# Patient Record
Sex: Female | Born: 1960 | Race: Black or African American | Hispanic: No
Health system: Southern US, Community
[De-identification: ages and names within clinical notes are randomized; demographics above are authoritative.]

## PROBLEM LIST (undated history)

## (undated) DIAGNOSIS — E119 Type 2 diabetes mellitus without complications: Secondary | ICD-10-CM

## (undated) DIAGNOSIS — I2699 Other pulmonary embolism without acute cor pulmonale: Secondary | ICD-10-CM

## (undated) DIAGNOSIS — I219 Acute myocardial infarction, unspecified: Secondary | ICD-10-CM

## (undated) DIAGNOSIS — I509 Heart failure, unspecified: Secondary | ICD-10-CM

## (undated) HISTORY — PX: BELOW KNEE LEG AMPUTATION: SUR23

---

## 2015-11-14 ENCOUNTER — Inpatient Hospital Stay (HOSPITAL_COMMUNITY)
Admission: EM | Admit: 2015-11-14 | Discharge: 2015-11-26 | DRG: 208 | Disposition: E | Payer: Self-pay | Attending: Pulmonary Disease | Admitting: Pulmonary Disease

## 2015-11-14 ENCOUNTER — Inpatient Hospital Stay (HOSPITAL_COMMUNITY): Payer: Self-pay

## 2015-11-14 ENCOUNTER — Emergency Department (HOSPITAL_COMMUNITY): Payer: Self-pay

## 2015-11-14 ENCOUNTER — Encounter (HOSPITAL_COMMUNITY): Payer: Self-pay

## 2015-11-14 DIAGNOSIS — R001 Bradycardia, unspecified: Secondary | ICD-10-CM | POA: Diagnosis present

## 2015-11-14 DIAGNOSIS — I679 Cerebrovascular disease, unspecified: Secondary | ICD-10-CM

## 2015-11-14 DIAGNOSIS — I6529 Occlusion and stenosis of unspecified carotid artery: Secondary | ICD-10-CM

## 2015-11-14 DIAGNOSIS — Z515 Encounter for palliative care: Secondary | ICD-10-CM | POA: Diagnosis not present

## 2015-11-14 DIAGNOSIS — E785 Hyperlipidemia, unspecified: Secondary | ICD-10-CM | POA: Diagnosis present

## 2015-11-14 DIAGNOSIS — I2699 Other pulmonary embolism without acute cor pulmonale: Secondary | ICD-10-CM

## 2015-11-14 DIAGNOSIS — F1910 Other psychoactive substance abuse, uncomplicated: Secondary | ICD-10-CM | POA: Diagnosis present

## 2015-11-14 DIAGNOSIS — I2782 Chronic pulmonary embolism: Secondary | ICD-10-CM

## 2015-11-14 DIAGNOSIS — Z794 Long term (current) use of insulin: Secondary | ICD-10-CM

## 2015-11-14 DIAGNOSIS — Z66 Do not resuscitate: Secondary | ICD-10-CM | POA: Diagnosis not present

## 2015-11-14 DIAGNOSIS — I5023 Acute on chronic systolic (congestive) heart failure: Secondary | ICD-10-CM | POA: Diagnosis present

## 2015-11-14 DIAGNOSIS — I5021 Acute systolic (congestive) heart failure: Secondary | ICD-10-CM

## 2015-11-14 DIAGNOSIS — E669 Obesity, unspecified: Secondary | ICD-10-CM | POA: Diagnosis present

## 2015-11-14 DIAGNOSIS — R402342 Coma scale, best motor response, flexion withdrawal, at arrival to emergency department: Secondary | ICD-10-CM | POA: Diagnosis present

## 2015-11-14 DIAGNOSIS — G9341 Metabolic encephalopathy: Secondary | ICD-10-CM | POA: Diagnosis present

## 2015-11-14 DIAGNOSIS — Z7901 Long term (current) use of anticoagulants: Secondary | ICD-10-CM

## 2015-11-14 DIAGNOSIS — N19 Unspecified kidney failure: Secondary | ICD-10-CM | POA: Diagnosis present

## 2015-11-14 DIAGNOSIS — R52 Pain, unspecified: Secondary | ICD-10-CM

## 2015-11-14 DIAGNOSIS — R402142 Coma scale, eyes open, spontaneous, at arrival to emergency department: Secondary | ICD-10-CM | POA: Diagnosis present

## 2015-11-14 DIAGNOSIS — I63412 Cerebral infarction due to embolism of left middle cerebral artery: Secondary | ICD-10-CM

## 2015-11-14 DIAGNOSIS — Z9289 Personal history of other medical treatment: Secondary | ICD-10-CM

## 2015-11-14 DIAGNOSIS — I639 Cerebral infarction, unspecified: Secondary | ICD-10-CM

## 2015-11-14 DIAGNOSIS — J9601 Acute respiratory failure with hypoxia: Principal | ICD-10-CM

## 2015-11-14 DIAGNOSIS — R579 Shock, unspecified: Secondary | ICD-10-CM

## 2015-11-14 DIAGNOSIS — Z8673 Personal history of transient ischemic attack (TIA), and cerebral infarction without residual deficits: Secondary | ICD-10-CM

## 2015-11-14 DIAGNOSIS — Z6827 Body mass index (BMI) 27.0-27.9, adult: Secondary | ICD-10-CM

## 2015-11-14 DIAGNOSIS — I252 Old myocardial infarction: Secondary | ICD-10-CM

## 2015-11-14 DIAGNOSIS — Z86711 Personal history of pulmonary embolism: Secondary | ICD-10-CM

## 2015-11-14 DIAGNOSIS — J9600 Acute respiratory failure, unspecified whether with hypoxia or hypercapnia: Secondary | ICD-10-CM | POA: Diagnosis present

## 2015-11-14 DIAGNOSIS — D649 Anemia, unspecified: Secondary | ICD-10-CM | POA: Diagnosis present

## 2015-11-14 DIAGNOSIS — N1330 Unspecified hydronephrosis: Secondary | ICD-10-CM | POA: Diagnosis present

## 2015-11-14 DIAGNOSIS — Z89511 Acquired absence of right leg below knee: Secondary | ICD-10-CM

## 2015-11-14 DIAGNOSIS — R569 Unspecified convulsions: Secondary | ICD-10-CM | POA: Diagnosis present

## 2015-11-14 DIAGNOSIS — I25810 Atherosclerosis of coronary artery bypass graft(s) without angina pectoris: Secondary | ICD-10-CM | POA: Diagnosis present

## 2015-11-14 DIAGNOSIS — Z79899 Other long term (current) drug therapy: Secondary | ICD-10-CM

## 2015-11-14 DIAGNOSIS — I110 Hypertensive heart disease with heart failure: Secondary | ICD-10-CM | POA: Diagnosis present

## 2015-11-14 DIAGNOSIS — E11649 Type 2 diabetes mellitus with hypoglycemia without coma: Secondary | ICD-10-CM | POA: Diagnosis not present

## 2015-11-14 DIAGNOSIS — I959 Hypotension, unspecified: Secondary | ICD-10-CM | POA: Diagnosis present

## 2015-11-14 DIAGNOSIS — J9602 Acute respiratory failure with hypercapnia: Secondary | ICD-10-CM | POA: Diagnosis present

## 2015-11-14 DIAGNOSIS — G9389 Other specified disorders of brain: Secondary | ICD-10-CM | POA: Diagnosis present

## 2015-11-14 DIAGNOSIS — I5022 Chronic systolic (congestive) heart failure: Secondary | ICD-10-CM

## 2015-11-14 DIAGNOSIS — L8990 Pressure ulcer of unspecified site, unspecified stage: Secondary | ICD-10-CM | POA: Diagnosis present

## 2015-11-14 DIAGNOSIS — I2510 Atherosclerotic heart disease of native coronary artery without angina pectoris: Secondary | ICD-10-CM | POA: Diagnosis present

## 2015-11-14 DIAGNOSIS — N200 Calculus of kidney: Secondary | ICD-10-CM

## 2015-11-14 DIAGNOSIS — I6340 Cerebral infarction due to embolism of unspecified cerebral artery: Secondary | ICD-10-CM | POA: Diagnosis present

## 2015-11-14 DIAGNOSIS — I161 Hypertensive emergency: Secondary | ICD-10-CM | POA: Diagnosis present

## 2015-11-14 DIAGNOSIS — R402242 Coma scale, best verbal response, confused conversation, at arrival to emergency department: Secondary | ICD-10-CM | POA: Diagnosis present

## 2015-11-14 DIAGNOSIS — G9340 Encephalopathy, unspecified: Secondary | ICD-10-CM

## 2015-11-14 HISTORY — DX: Acute myocardial infarction, unspecified: I21.9

## 2015-11-14 HISTORY — DX: Heart failure, unspecified: I50.9

## 2015-11-14 HISTORY — DX: Other pulmonary embolism without acute cor pulmonale: I26.99

## 2015-11-14 HISTORY — DX: Type 2 diabetes mellitus without complications: E11.9

## 2015-11-14 LAB — I-STAT ARTERIAL BLOOD GAS, ED
Acid-base deficit: 2 mmol/L (ref 0.0–2.0)
Acid-base deficit: 1 mmol/L (ref 0.0–2.0)
Acid-base deficit: 1 mmol/L (ref 0.0–2.0)
BICARBONATE: 26.5 meq/L — AB (ref 20.0–24.0)
BICARBONATE: 23.9 meq/L (ref 20.0–24.0)
Bicarbonate: 26 mEq/L — ABNORMAL HIGH (ref 20.0–24.0)
O2 SAT: 91 %
O2 Saturation: 86 %
O2 Saturation: 89 %
PCO2 ART: 56.3 mmHg — AB (ref 35.0–45.0)
PCO2 ART: 53.4 mmHg — AB (ref 35.0–45.0)
PO2 ART: 64 mmHg — AB (ref 80.0–100.0)
PO2 ART: 55 mmHg — AB (ref 80.0–100.0)
Patient temperature: 35.1
Patient temperature: 34.9
TCO2: 28 mmol/L (ref 0–100)
TCO2: 28 mmol/L (ref 0–100)
TCO2: 25 mmol/L (ref 0–100)
pCO2 arterial: 37.9 mmHg (ref 35.0–45.0)
pH, Arterial: 7.262 — ABNORMAL LOW (ref 7.350–7.450)
pH, Arterial: 7.293 — ABNORMAL LOW (ref 7.350–7.450)
pH, Arterial: 7.408 (ref 7.350–7.450)
pO2, Arterial: 51 mmHg — ABNORMAL LOW (ref 80.0–100.0)

## 2015-11-14 LAB — COMPREHENSIVE METABOLIC PANEL
ALK PHOS: 143 U/L — AB (ref 38–126)
ALT: 55 U/L — ABNORMAL HIGH (ref 14–54)
AST: 89 U/L — AB (ref 15–41)
Albumin: 2.9 g/dL — ABNORMAL LOW (ref 3.5–5.0)
Anion gap: 13 (ref 5–15)
BILIRUBIN TOTAL: 0.7 mg/dL (ref 0.3–1.2)
BUN: 30 mg/dL — AB (ref 6–20)
CALCIUM: 8.4 mg/dL — AB (ref 8.9–10.3)
CO2: 21 mmol/L — ABNORMAL LOW (ref 22–32)
Chloride: 104 mmol/L (ref 101–111)
Creatinine, Ser: 1.33 mg/dL — ABNORMAL HIGH (ref 0.44–1.00)
GFR calc Af Amer: 52 mL/min — ABNORMAL LOW (ref >60)
GFR calc non Af Amer: 45 mL/min — ABNORMAL LOW (ref >60)
GLUCOSE: 98 mg/dL (ref 65–99)
Potassium: 4.8 mmol/L (ref 3.5–5.1)
SODIUM: 138 mmol/L (ref 135–145)
TOTAL PROTEIN: 7 g/dL (ref 6.5–8.1)

## 2015-11-14 LAB — I-STAT CG4 LACTIC ACID, ED: LACTIC ACID, VENOUS: 2.44 mmol/L — AB (ref 0.5–2.0)

## 2015-11-14 LAB — URINALYSIS, ROUTINE W REFLEX MICROSCOPIC
Bilirubin Urine: NEGATIVE
GLUCOSE, UA: NEGATIVE mg/dL
Ketones, ur: NEGATIVE mg/dL
LEUKOCYTES UA: NEGATIVE
Nitrite: NEGATIVE
PH: 6.5 (ref 5.0–8.0)
Specific Gravity, Urine: 1.015 (ref 1.005–1.030)

## 2015-11-14 LAB — TROPONIN I: TROPONIN I: 0.05 ng/mL — AB (ref <0.031)

## 2015-11-14 LAB — CBC
HCT: 32.5 % — ABNORMAL LOW (ref 36.0–46.0)
Hemoglobin: 10.2 g/dL — ABNORMAL LOW (ref 12.0–15.0)
MCH: 28.1 pg (ref 26.0–34.0)
MCHC: 31.4 g/dL (ref 30.0–36.0)
MCV: 89.5 fL (ref 78.0–100.0)
PLATELETS: 360 10*3/uL (ref 150–400)
RBC: 3.63 MIL/uL — ABNORMAL LOW (ref 3.87–5.11)
RDW: 14.9 % (ref 11.5–15.5)
WBC: 9.3 10*3/uL (ref 4.0–10.5)

## 2015-11-14 LAB — CBG MONITORING, ED: Glucose-Capillary: 89 mg/dL (ref 65–99)

## 2015-11-14 LAB — PROTIME-INR
INR: 1.34 (ref 0.00–1.49)
INR: 1.38 (ref 0.00–1.49)
Prothrombin Time: 16.7 seconds — ABNORMAL HIGH (ref 11.6–15.2)
Prothrombin Time: 17.1 seconds — ABNORMAL HIGH (ref 11.6–15.2)

## 2015-11-14 LAB — I-STAT CHEM 8, ED
BUN: 34 mg/dL — AB (ref 6–20)
CALCIUM ION: 1.08 mmol/L — AB (ref 1.12–1.23)
CHLORIDE: 103 mmol/L (ref 101–111)
Creatinine, Ser: 1.2 mg/dL — ABNORMAL HIGH (ref 0.44–1.00)
GLUCOSE: 88 mg/dL (ref 65–99)
HCT: 39 % (ref 36.0–46.0)
Hemoglobin: 13.3 g/dL (ref 12.0–15.0)
Potassium: 4.8 mmol/L (ref 3.5–5.1)
Sodium: 139 mmol/L (ref 135–145)
TCO2: 25 mmol/L (ref 0–100)

## 2015-11-14 LAB — I-STAT TROPONIN, ED: Troponin i, poc: 0.02 ng/mL (ref 0.00–0.08)

## 2015-11-14 LAB — LIPID PANEL
CHOL/HDL RATIO: 2.3 ratio
Cholesterol: 117 mg/dL (ref 0–200)
HDL: 51 mg/dL (ref >40)
LDL CALC: 61 mg/dL (ref 0–99)
TRIGLYCERIDES: 23 mg/dL (ref <150)
VLDL: 5 mg/dL (ref 0–40)

## 2015-11-14 LAB — LACTIC ACID, PLASMA: Lactic Acid, Venous: 1.1 mmol/L (ref 0.5–2.0)

## 2015-11-14 LAB — URINE MICROSCOPIC-ADD ON

## 2015-11-14 LAB — PHOSPHORUS: Phosphorus: 5.1 mg/dL — ABNORMAL HIGH (ref 2.5–4.6)

## 2015-11-14 LAB — HEPARIN LEVEL (UNFRACTIONATED): HEPARIN UNFRACTIONATED: 0.82 [IU]/mL — AB (ref 0.30–0.70)

## 2015-11-14 LAB — GLUCOSE, CAPILLARY
GLUCOSE-CAPILLARY: 52 mg/dL — AB (ref 65–99)
Glucose-Capillary: 110 mg/dL — ABNORMAL HIGH (ref 65–99)

## 2015-11-14 LAB — MAGNESIUM: Magnesium: 2.1 mg/dL (ref 1.7–2.4)

## 2015-11-14 LAB — BRAIN NATRIURETIC PEPTIDE: B Natriuretic Peptide: 1608.9 pg/mL — ABNORMAL HIGH (ref 0.0–100.0)

## 2015-11-14 LAB — APTT: aPTT: 35 seconds (ref 24–37)

## 2015-11-14 MED ORDER — SODIUM CHLORIDE 0.9% FLUSH: 3.0000 mL | INTRAVENOUS | Status: DC | PRN

## 2015-11-14 MED ORDER — ASPIRIN 81 MG PO CHEW: 324.0000 mg | CHEWABLE_TABLET | ORAL | Status: DC

## 2015-11-14 MED ORDER — ROCURONIUM BROMIDE 50 MG/5ML IV SOLN
INTRAVENOUS | Status: DC | PRN
  Administered 2015-11-14: 100 mg via INTRAVENOUS

## 2015-11-14 MED ORDER — SODIUM CHLORIDE 0.9 % IV SOLN: 250.0000 mL | INTRAVENOUS | Status: DC | PRN

## 2015-11-14 MED ORDER — INSULIN ASPART 100 UNIT/ML ~~LOC~~ SOLN
2.0000 [IU] | SUBCUTANEOUS | Status: DC
  Administered 2015-11-15: 4 [IU] via SUBCUTANEOUS

## 2015-11-14 MED ORDER — SODIUM CHLORIDE 0.9 % IV SOLN
25.0000 ug/h | INTRAVENOUS | Status: DC
  Administered 2015-11-14: 50 ug/h via INTRAVENOUS
  Administered 2015-11-15: 100 ug/h via INTRAVENOUS
  Filled 2015-11-14 (×3): qty 50

## 2015-11-14 MED ORDER — FENTANYL CITRATE (PF) 100 MCG/2ML IJ SOLN
50.0000 ug | Freq: Once | INTRAMUSCULAR | Status: AC
  Administered 2015-11-14: 50 ug via INTRAVENOUS
  Filled 2015-11-14: qty 2

## 2015-11-14 MED ORDER — PROPOFOL 1000 MG/100ML IV EMUL
INTRAVENOUS | Status: AC
Start: 2015-11-14 — End: 2015-11-14
  Administered 2015-11-14: 10 ug/kg/min via INTRAVENOUS
  Filled 2015-11-14: qty 100

## 2015-11-14 MED ORDER — HEPARIN (PORCINE) IN NACL 100-0.45 UNIT/ML-% IJ SOLN
1150.0000 [IU]/h | INTRAMUSCULAR | Status: DC
  Administered 2015-11-14: 1300 [IU]/h via INTRAVENOUS
  Filled 2015-11-14 (×3): qty 250

## 2015-11-14 MED ORDER — ASPIRIN 81 MG PO CHEW
324.0000 mg | CHEWABLE_TABLET | Freq: Once | ORAL | Status: AC
  Administered 2015-11-14: 324 mg via ORAL
  Filled 2015-11-14: qty 4

## 2015-11-14 MED ORDER — FUROSEMIDE 10 MG/ML IJ SOLN: 40.0000 mg | Freq: Once | INTRAMUSCULAR | Status: DC

## 2015-11-14 MED ORDER — MIDAZOLAM HCL 2 MG/2ML IJ SOLN
2.0000 mg | INTRAMUSCULAR | Status: DC | PRN
  Administered 2015-11-15 (×5): 2 mg via INTRAVENOUS
  Filled 2015-11-14 (×5): qty 2

## 2015-11-14 MED ORDER — AMLODIPINE BESYLATE 10 MG PO TABS
10.0000 mg | ORAL_TABLET | Freq: Every day | ORAL | Status: DC
  Administered 2015-11-15: 10 mg via ORAL
  Filled 2015-11-14: qty 1

## 2015-11-14 MED ORDER — LEVETIRACETAM 250 MG PO TABS
500.0000 mg | ORAL_TABLET | Freq: Two times a day (BID) | ORAL | Status: DC
  Administered 2015-11-14: 500 mg via ORAL
  Filled 2015-11-14: qty 2

## 2015-11-14 MED ORDER — IOPAMIDOL (ISOVUE-370) INJECTION 76%
INTRAVENOUS | Status: AC
  Administered 2015-11-14: 19:00:00
  Filled 2015-11-14: qty 100

## 2015-11-14 MED ORDER — MIDAZOLAM HCL 2 MG/2ML IJ SOLN
2.0000 mg | INTRAMUSCULAR | Status: AC | PRN
  Administered 2015-11-15 (×3): 2 mg via INTRAVENOUS
  Filled 2015-11-14 (×3): qty 2

## 2015-11-14 MED ORDER — FENTANYL BOLUS VIA INFUSION
50.0000 ug | INTRAVENOUS | Status: DC | PRN
  Filled 2015-11-14: qty 50

## 2015-11-14 MED ORDER — ASPIRIN 300 MG RE SUPP: 300.0000 mg | RECTAL | Status: DC

## 2015-11-14 MED ORDER — NOREPINEPHRINE BITARTRATE 1 MG/ML IV SOLN
0.0000 ug/min | INTRAVENOUS | Status: DC
  Administered 2015-11-15: 2 ug/min via INTRAVENOUS
  Filled 2015-11-14 (×2): qty 4

## 2015-11-14 MED ORDER — FUROSEMIDE 10 MG/ML IJ SOLN
40.0000 mg | INTRAMUSCULAR | Status: AC
  Administered 2015-11-14: 40 mg via INTRAVENOUS
  Filled 2015-11-14: qty 4

## 2015-11-14 MED ORDER — DEXTROSE 50 % IV SOLN
25.0000 mL | Freq: Once | INTRAVENOUS | Status: AC
  Administered 2015-11-14: 25 mL via INTRAVENOUS

## 2015-11-14 MED ORDER — NITROGLYCERIN 0.4 MG SL SUBL: 0.4000 mg | SUBLINGUAL_TABLET | SUBLINGUAL | Status: DC | PRN

## 2015-11-14 MED ORDER — PROPOFOL 1000 MG/100ML IV EMUL
5.0000 ug/kg/min | Freq: Once | INTRAVENOUS | Status: AC
  Administered 2015-11-14: 10 ug/kg/min via INTRAVENOUS

## 2015-11-14 MED ORDER — NITROGLYCERIN IN D5W 200-5 MCG/ML-% IV SOLN: 5.0000 ug/min | Freq: Once | INTRAVENOUS | Status: DC

## 2015-11-14 MED ORDER — PANTOPRAZOLE SODIUM 40 MG IV SOLR
40.0000 mg | Freq: Every day | INTRAVENOUS | Status: DC
  Administered 2015-11-14 – 2015-11-15 (×2): 40 mg via INTRAVENOUS
  Filled 2015-11-14 (×2): qty 40

## 2015-11-14 MED ORDER — DEXTROSE 50 % IV SOLN
INTRAVENOUS | Status: AC
  Filled 2015-11-14: qty 50

## 2015-11-14 MED ORDER — PROPOFOL 1000 MG/100ML IV EMUL
5.0000 ug/kg/min | INTRAVENOUS | Status: DC
  Administered 2015-11-14: 35 ug/kg/min via INTRAVENOUS
  Administered 2015-11-15: 40 ug/kg/min via INTRAVENOUS
  Administered 2015-11-15: 30 ug/kg/min via INTRAVENOUS
  Administered 2015-11-15: 50 ug/kg/min via INTRAVENOUS
  Administered 2015-11-16: 40 ug/kg/min via INTRAVENOUS
  Administered 2015-11-16 (×2): 50 ug/kg/min via INTRAVENOUS
  Filled 2015-11-14 (×7): qty 100

## 2015-11-14 MED ORDER — ETOMIDATE 2 MG/ML IV SOLN
INTRAVENOUS | Status: DC | PRN
  Administered 2015-11-14: 20 mg via INTRAVENOUS

## 2015-11-14 MED ORDER — SODIUM CHLORIDE 0.9% FLUSH
3.0000 mL | Freq: Two times a day (BID) | INTRAVENOUS | Status: DC
Start: 2015-11-14 — End: 2015-11-16
  Administered 2015-11-14 – 2015-11-16 (×3): 3 mL via INTRAVENOUS

## 2015-11-14 NOTE — ED Notes (Signed)
Pt transported to CT by this RN and Respiratory.

## 2015-11-14 NOTE — ED Provider Notes (Signed)
CSN: [id number]     Arrival date & time 11/22/2015  1617 History   First MD Initiated Contact with Patient 22-Nov-2015 1623     Chief Complaint  Patient presents with  . Respiratory Distress     (Consider location/radiation/quality/duration/timing/severity/associated sxs/prior Treatment) HPI Comments: The patient is a 55 year old female, we do not have any prior medical records on this patient in the system, she is unable to speak due to severe respiratory distress. She presents by ambulance transport after a phone call went out for respiratory distress. They found the patient to be hypoxic, using accessory muscles and confused and combative. Reportedly the patient is a diabetic as well as having congestive heart failure. 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  The patient refused BiPAP, she eventually became obtunded in route, she was given 2 mg of Versed by intramuscular route at my request through EMS prehospital.  The history is provided by the patient and the EMS personnel.    Past Medical History  Diagnosis Date  . Diabetes mellitus without complication (HCC)   . CHF (congestive heart failure) (HCC)   . PE (pulmonary embolism)   . MI (myocardial infarction) [facility])    Past Surgical History  Procedure Laterality Date  . Below knee leg amputation     No family history on file. Social History  Substance Use Topics  . Smoking status: Not on file  . Smokeless tobacco: Not on file  . Alcohol Use: Not on file   OB History    No data available     Review of Systems  Unable to perform ROS: Severe respiratory distress      Allergies  Review of patient's allergies indicates no known allergies.  Home Medications   Prior to Admission medications   Medication Sig Start Date End Date Taking? Authorizing Provider  amLODipine (NORVASC) 10 MG tablet Take 10 mg by mouth daily.   Yes Historical  Provider, MD  atorvastatin (LIPITOR) 80 MG tablet Take 80 mg by mouth daily.   Yes Historical Provider, MD  busPIRone (BUSPAR) 10 MG tablet Take 10 mg by mouth 3 (three) times daily.   Yes Historical Provider, MD  insulin glargine (LANTUS) 100 UNIT/ML injection Inject 40 Units into the skin at bedtime.   Yes Historical Provider, MD  insulin lispro (HUMALOG) 100 UNIT/ML injection Inject 6 Units into the skin 3 (three) times daily before meals.   Yes Historical Provider, MD  levETIRAcetam (KEPPRA) 500 MG tablet Take 500 mg by mouth 2 (two) times daily.   Yes Historical Provider, MD  metoprolol tartrate (LOPRESSOR) 25 MG tablet Take 25 mg by mouth 2 (two) times daily.   Yes Historical Provider, MD  traMADol (ULTRAM) 50 MG tablet Take 50 mg by mouth every 6 (six) hours as needed for moderate pain.   Yes Historical Provider, MD  XARELTO STARTER PACK 15 & 20 MG TBPK SEE INSTRUCTIONS ON PACKET-15MG  TWICE DAILY FOR 21 DAYS, THEN SWITCHING TO  ONCE DAILY (STARTED 11/13/15) 11/12/15  Yes Historical Provider, MD   BP 104/70 mmHg  Pulse 58  Temp(Src) 98.1 F (36.7 C)  Resp 26  Ht  (1.651 m)  Wt 164 lb 14.5 oz (74.8 kg)  BMI 27.44 kg/m2  SpO2 100%  LMP  Physical Exam  Constitutional: She appears well-developed and well-nourished. She appears distressed.  HENT:  Head: Normocephalic and atraumatic.  Mouth/Throat:  Oropharynx is clear and moist. No oropharyngeal exudate.  Eyes: Conjunctivae and EOM are normal. Pupils are equal, round, and reactive to light. Right eye exhibits no discharge. Left eye exhibits no discharge. No scleral icterus.  Neck: Normal range of motion. Neck supple. No JVD present. No thyromegaly present.  Cardiovascular: Normal rate, regular rhythm, normal heart sounds and intact distal pulses.  Exam reveals no gallop and no friction rub.   No murmur heard. Pulmonary/Chest: She is in respiratory distress. She has wheezes. She has rales.  Abdominal: Soft. Bowel sounds are  normal. She exhibits no distension and no mass. There is no tenderness.  Musculoskeletal: Normal range of motion. She exhibits edema. She exhibits no tenderness.  Lymphadenopathy:    She has no cervical adenopathy.  Neurological:  Obtunded, does not follow commands, combative occasionally  Skin: Skin is warm and dry. No rash noted. No erythema.  Psychiatric: She has a normal mood and affect. Her behavior is normal.  Nursing note and vitals reviewed.   ED Course  .Intubation Performed by: Eber Hong Authorized by: Eber Hong Consent: The procedure was performed in an emergent situation. Required items: required blood products, implants, devices, and special equipment available Patient identity confirmed: arm band Time out: Immediately prior to procedure a "time out" was called to verify the correct patient, procedure, equipment, support staff and site/side marked as required. Indications: respiratory distress Intubation method: direct Patient status: paralyzed (RSI) Preoxygenation: BVM Sedatives: etomidate Paralytic: rocuronium Laryngoscope size: Mac 4 Tube size: 7.5 mm Tube type: cuffed Number of attempts: 1 Cricoid pressure: no Cords visualized: yes Post-procedure assessment: chest rise and CO2 detector Breath sounds: equal and absent over the epigastrium Cuff inflated: yes ETT to lip: 21 cm ETT to teeth: 20 cm Tube secured with: ETT holder Chest x-ray interpreted by me. Chest x-ray findings: endotracheal tube in appropriate position Patient tolerance: Patient tolerated the procedure well with no immediate complications  OG placement Performed by: Eber Hong Authorized by: Eber Hong Consent: The procedure was performed in an emergent situation. Required items: required blood products, implants, devices, and special equipment available Patient identity confirmed: arm band Time out: Immediately prior to procedure a "time out" was called to verify the correct  patient, procedure, equipment, support staff and site/side marked as required. Preparation: Patient was prepped and draped in the usual sterile fashion. Local anesthesia used: no Patient tolerance: Patient tolerated the procedure well with no immediate complications   (including critical care time) Labs Review Labs Reviewed  CBC - Abnormal; Notable for the following:    RBC 3.63 (*)    Hemoglobin 10.2 (*)    HCT 32.5 (*)    All other components within normal limits  COMPREHENSIVE METABOLIC PANEL - Abnormal; Notable for the following:    CO2 21 (*)    BUN 30 (*)    Creatinine, Ser 1.33 (*)    Calcium 8.4 (*)    Albumin 2.9 (*)    AST 89 (*)    ALT 55 (*)    Alkaline Phosphatase 143 (*)    GFR calc non Af Amer 45 (*)    GFR calc Af Amer 52 (*)    All other components within normal limits  PROTIME-INR - Abnormal; Notable for the following:    Prothrombin Time 16.7 (*)    All other components within normal limits  URINALYSIS, ROUTINE W REFLEX MICROSCOPIC (NOT AT [facility]) - Abnormal; Notable for the following:    Hgb urine dipstick MODERATE (*)  Protein, ur >300 (*)    All other components within normal limits  BRAIN NATRIURETIC PEPTIDE - Abnormal; Notable for the following:    B Natriuretic Peptide 1608.9 (*)    All other components within normal limits  URINE MICROSCOPIC-ADD ON - Abnormal; Notable for the following:    Squamous Epithelial / LPF 0-5 (*)    Bacteria, UA FEW (*)    All other components within normal limits  PHOSPHORUS - Abnormal; Notable for the following:    Phosphorus 5.1 (*)    All other components within normal limits  TROPONIN I - Abnormal; Notable for the following:    Troponin I 0.05 (*)    All other components within normal limits  PROTIME-INR - Abnormal; Notable for the following:    Prothrombin Time 17.1 (*)    All other components within normal limits  HEPARIN LEVEL (UNFRACTIONATED) - Abnormal; Notable for the following:    Heparin Unfractionated  0.82 (*)    All other components within normal limits  GLUCOSE, CAPILLARY - Abnormal; Notable for the following:    Glucose-Capillary 52 (*)    All other components within normal limits  GLUCOSE, CAPILLARY - Abnormal; Notable for the following:    Glucose-Capillary 110 (*)    All other components within normal limits  I-STAT CG4 LACTIC ACID, ED - Abnormal; Notable for the following:    Lactic Acid, Venous 2.44 (*)    All other components within normal limits  I-STAT CHEM 8, ED - Abnormal; Notable for the following:    BUN 34 (*)    Creatinine, Ser 1.20 (*)    Calcium, Ion 1.08 (*)    All other components within normal limits  I-STAT ARTERIAL BLOOD GAS, ED - Abnormal; Notable for the following:    pH, Arterial 7.262 (*)    pCO2 arterial 56.3 (*)    pO2, Arterial 64.0 (*)    Bicarbonate 26.0 (*)    All other components within normal limits  I-STAT ARTERIAL BLOOD GAS, ED - Abnormal; Notable for the following:    pH, Arterial 7.293 (*)    pCO2 arterial 53.4 (*)    pO2, Arterial 51.0 (*)    Bicarbonate 26.5 (*)    All other components within normal limits  I-STAT ARTERIAL BLOOD GAS, ED - Abnormal; Notable for the following:    pO2, Arterial 55.0 (*)    All other components within normal limits  URINE CULTURE  CULTURE, BLOOD (ROUTINE X 2)  CULTURE, BLOOD (ROUTINE X 2)  MRSA PCR SCREENING  APTT  MAGNESIUM  LACTIC ACID, PLASMA  LIPID PANEL  URINE RAPID DRUG SCREEN, HOSP PERFORMED  TROPONIN I  TROPONIN I  CBC  BASIC METABOLIC PANEL  MAGNESIUM  PHOSPHORUS  HEPARIN LEVEL (UNFRACTIONATED)  APTT  LACTIC ACID, PLASMA  I-STAT TROPOININ, ED  I-STAT CG4 LACTIC ACID, ED  CBG MONITORING, ED    Imaging Review Ct Head Wo Contrast  12-05-2015  CLINICAL DATA:  55 year old female found down by family unresponsive. EXAM: CT HEAD WITHOUT CONTRAST CT CERVICAL SPINE WITHOUT CONTRAST TECHNIQUE: Multidetector CT imaging of the head and cervical spine was performed following the standard  protocol without intravenous contrast. Multiplanar CT image reconstructions of the cervical spine were also generated. COMPARISON:  No priors. FINDINGS: CT HEAD FINDINGS Intubated patient. Orogastric tube in position. Low-attenuation in the left frontal region involving the gray matter, subcortical white matter and to underlying white matter, best appreciated on image 24 of series 2. No acute intra cerebral hemorrhage.  No hydrocephalus. No shift of midline structures. Basal cisterns are patent. No acute displaced skull fractures. Visualized paranasal sinuses and mastoids are well pneumatized. CT CERVICAL SPINE FINDINGS No acute displaced fractures of the cervical spine. Alignment is anatomic. Prevertebral soft tissues cannot be evaluated secondary to the indwelling endotracheal and orogastric tubes. Visualized portions of the upper thorax demonstrate large bilateral pleural effusions lying dependently. IMPRESSION: 1. Large area of low attenuation in the left frontal lobe concerning for an area of subacute ischemia. Alternatively, a mass with surrounding edema is not excluded, and further evaluation with brain MRI with and without IV gadolinium is recommended in the near future. 2. No acute abnormality of the cervical spine. Critical Value/emergent results were called by telephone at the time of interpretation on 11/10/2015 at 7:41 pm to Dr. Eber Hong, who verbally acknowledged these results. Electronically Signed   By: Trudie Reed M.D.   On: 11/23/2015 19:44   Ct Angio Chest Pe W/cm &/or Wo Cm  11/24/2015  CLINICAL DATA:  Found unresponsive by family. EXAM: CT ANGIOGRAPHY CHEST CT ABDOMEN AND PELVIS WITH CONTRAST TECHNIQUE: Multidetector CT imaging of the chest was performed using the standard protocol during bolus administration of intravenous contrast. Multiplanar CT image reconstructions and MIPs were obtained to evaluate the vascular anatomy. Multidetector CT imaging of the abdomen and pelvis was  performed using the standard protocol during bolus administration of intravenous contrast. CONTRAST:  100 cc Isovue 370 COMPARISON:  Radiography same day FINDINGS: CTA CHEST FINDINGS Pulmonary arterial opacification is good. No pulmonary emboli. No aortic opacification is minimal. No sign of aneurysm or dissection as visualized. The heart is enlarged. No pericardial effusion. Coronary artery calcification is noted. There are large bilateral pleural effusions layering dependently with dependent pulmonary atelectasis. The aerated lung shows interstitial prominence consistent with interstitial edema. No significant bone finding in the chest. CT ABDOMEN and PELVIS FINDINGS No hepatic abnormality is seen. There is hepatic venous reflux because of poor right heart function. The spleen is normal. No evidence of pancreatic mass or pancreatitis. No renal mass lesion is seen. There is hyperdense material in the renal collecting system bilaterally. On the right, this may be a combination of staghorn calculus and excreted contrast from a previous test injection. On the left, there almost certainly are numerous stones dependent in the lower pole and in the extrarenal pelvis and possibly a stone in the proximal ureter. Some of this density could relate to excreted contrast, but the densities do seem more discrete on this side. Unfortunately, delayed images were done within 1 minutes of the initial contrast administration. The aorta is unremarkable. IVC is normal. No retroperitoneal mass or lymphadenopathy. No free intraperitoneal fluid or air. No acute bowel finding. Appendix is normal. Moderate amount of fecal matter in the colon. Uterus and adnexal regions are unremarkable. Foley catheter present within the bladder. Tiny amount of free fluid in the pelvis, not significant. IMPRESSION: CTA chest: No pulmonary emboli. Findings consistent with heart failure with large effusions and dependent pulmonary atelectasis. Cardiomegaly.  Interstitial edema. CT abdomen pelvis: No evidence of acute pathology affecting the liver, spleen or pancreas. The patient has renal stone disease at least on the left, with small stones dependent in the collecting system and possibly in the proximal left ureter. Difficult to evaluate accurately due to the presence of some excreted contrast and the absence of significantly delayed imaging. Electronically Signed   By: Paulina Fusi M.D.   On: 11/18/2015 20:27   Ct Cervical Spine  Wo Contrast  11/02/2015  CLINICAL DATA:  55 year old female found down by family unresponsive. EXAM: CT HEAD WITHOUT CONTRAST CT CERVICAL SPINE WITHOUT CONTRAST TECHNIQUE: Multidetector CT imaging of the head and cervical spine was performed following the standard protocol without intravenous contrast. Multiplanar CT image reconstructions of the cervical spine were also generated. COMPARISON:  No priors. FINDINGS: CT HEAD FINDINGS Intubated patient. Orogastric tube in position. Low-attenuation in the left frontal region involving the gray matter, subcortical white matter and to underlying white matter, best appreciated on image 24 of series 2. No acute intra cerebral hemorrhage. No hydrocephalus. No shift of midline structures. Basal cisterns are patent. No acute displaced skull fractures. Visualized paranasal sinuses and mastoids are well pneumatized. CT CERVICAL SPINE FINDINGS No acute displaced fractures of the cervical spine. Alignment is anatomic. Prevertebral soft tissues cannot be evaluated secondary to the indwelling endotracheal and orogastric tubes. Visualized portions of the upper thorax demonstrate large bilateral pleural effusions lying dependently. IMPRESSION: 1. Large area of low attenuation in the left frontal lobe concerning for an area of subacute ischemia. Alternatively, a mass with surrounding edema is not excluded, and further evaluation with brain MRI with and without IV gadolinium is recommended in the near future. 2.  No acute abnormality of the cervical spine. Critical Value/emergent results were called by telephone at the time of interpretation on 11/08/2015 at 7:41 pm to Dr. Eber Hong, who verbally acknowledged these results. Electronically Signed   By: Trudie Reed M.D.   On: 11/24/2015 19:44   Ct Abdomen Pelvis W Contrast  11/19/2015  CLINICAL DATA:  Found unresponsive by family. EXAM: CT ANGIOGRAPHY CHEST CT ABDOMEN AND PELVIS WITH CONTRAST TECHNIQUE: Multidetector CT imaging of the chest was performed using the standard protocol during bolus administration of intravenous contrast. Multiplanar CT image reconstructions and MIPs were obtained to evaluate the vascular anatomy. Multidetector CT imaging of the abdomen and pelvis was performed using the standard protocol during bolus administration of intravenous contrast. CONTRAST:  100 cc Isovue 370 COMPARISON:  Radiography same day FINDINGS: CTA CHEST FINDINGS Pulmonary arterial opacification is good. No pulmonary emboli. No aortic opacification is minimal. No sign of aneurysm or dissection as visualized. The heart is enlarged. No pericardial effusion. Coronary artery calcification is noted. There are large bilateral pleural effusions layering dependently with dependent pulmonary atelectasis. The aerated lung shows interstitial prominence consistent with interstitial edema. No significant bone finding in the chest. CT ABDOMEN and PELVIS FINDINGS No hepatic abnormality is seen. There is hepatic venous reflux because of poor right heart function. The spleen is normal. No evidence of pancreatic mass or pancreatitis. No renal mass lesion is seen. There is hyperdense material in the renal collecting system bilaterally. On the right, this may be a combination of staghorn calculus and excreted contrast from a previous test injection. On the left, there almost certainly are numerous stones dependent in the lower pole and in the extrarenal pelvis and possibly a stone in the  proximal ureter. Some of this density could relate to excreted contrast, but the densities do seem more discrete on this side. Unfortunately, delayed images were done within 1 minutes of the initial contrast administration. The aorta is unremarkable. IVC is normal. No retroperitoneal mass or lymphadenopathy. No free intraperitoneal fluid or air. No acute bowel finding. Appendix is normal. Moderate amount of fecal matter in the colon. Uterus and adnexal regions are unremarkable. Foley catheter present within the bladder. Tiny amount of free fluid in the pelvis, not significant. IMPRESSION: CTA  chest: No pulmonary emboli. Findings consistent with heart failure with large effusions and dependent pulmonary atelectasis. Cardiomegaly. Interstitial edema. CT abdomen pelvis: No evidence of acute pathology affecting the liver, spleen or pancreas. The patient has renal stone disease at least on the left, with small stones dependent in the collecting system and possibly in the proximal left ureter. Difficult to evaluate accurately due to the presence of some excreted contrast and the absence of significantly delayed imaging. Electronically Signed   By: Paulina Fusi M.D.   On: 11/19/2015 20:27   Dg Chest Portable 1 View  11-19-15  CLINICAL DATA:  Endotracheal tube placement. Worsening shortness of breath today. EXAM: PORTABLE CHEST 1 VIEW COMPARISON:  None. FINDINGS: The endotracheal tube is 5 cm above the carina. The NG tube is coursing down the esophagus and into the stomach. The heart is borderline in size given the supine position the patient in the AP projection. Diffuse interstitial and airspace process is likely pulmonary edema. IMPRESSION: Endotracheal tube in good position, 5 cm above the carina. Pulmonary edema and possible small left pleural effusion. Electronically Signed   By: Rudie Meyer M.D.   On: 11-19-2015 17:15   Dg Knee Left Port  11/19/2015  CLINICAL DATA:  Fall on tile floor last night. Anterior  knee swelling above the patella. EXAM: PORTABLE LEFT KNEE - 1-2 VIEW COMPARISON:  None. 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 Vascular calcifications also noted. Abnormal thickening of the prepatellar subcutaneous tissues suggesting prepatellar bursitis. No knee effusion. Probably degenerative osteochondral lesion along the posterior patellar ridge superiorly. Marginal articular spurring in the patella along with spurring along the patellar tendon attachment site. IMPRESSION: 1. Prominent prepatellar soft tissue swelling suggesting prepatellar bursitis. 2. Mild degenerative marginal articular spurring along the patella with a degenerative osteochondral lesion superiorly along the patellar articular surface. 3. 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 Electronically Signed   By: Gaylyn Rong M.D.   On: 11/19/2015 18:15   I have personally reviewed and evaluated these images and lab results as part of my medical decision-making.   EKG Interpretation   Date/Time:  Wednesday 2015/11/19 16:23:17 EDT Ventricular Rate:  70 PR Interval:  153 QRS Duration: 128 QT Interval:  445 QTC Calculation: 480 R Axis:   56 Text Interpretation:  Sinus rhythm Probable left atrial enlargement Left  ventricular hypertrophy No old tracing to compare Abnormal ekg Confirmed  by Shea Swalley  MD, Rufino Staup ([id number]) on 2015-11-19 4:34:50 PM      MDM   Final diagnoses:  Acute respiratory failure with hypoxia (HCC)  Acute systolic congestive heart failure (HCC)    We'll obtain a stat chest x-ray to evaluate the lungs as the frothy pulmonary edema in her oral pharynx during intubation suggest a cardiac source. I have no prior medical records, she will need labs, chest x-ray, she does have a sign of a hematoma to her patella, there is possibly a fall, would anticipate the trauma could be involved though she  does not have any apparent signs of trauma to her head and clearly has a respiratory or cardiac source of her distress. Her pulses in a normal range, her blood pressure is hypertensive. There is no family members with her, we do not have a medication list.  Family members now present and available giving additional history, she reports that the patient was admitted approximately one month ago in [place] having a myocardial infarction,  approximately one week ago the patient was released from a hospital in [place] where she was admitted for a pulmonary embolism and stated couple of days. She has been taking her medications but last night she became more short of breath, she asked her family member if she couldn't get into bed with her, she appeared agitated, this morning she did not get out of the car when they were doing errands and appeared gradually more tachypneic, para medics were eventually called because the patient was very short of breath and becoming agitated. According to the family member the patient did leave AGAINST MEDICAL ADVICE from the initial hospital in [place] after being diagnosed with a myocardial infarction.  D/w Radiology - they state that there is possible brain infarct vs edema / mass.  Chest Ct is also very abnormal.  CRITICAL CARE Performed by: Vida RollerMILLER,Monetta Lick D Total critical care time: 60 minutes Critical care time was exclusive of separately billable procedures and treating other patients. Critical care was necessary to treat or prevent imminent or life-threatening deterioration. Critical care was time spent personally by me on the following activities: development of treatment plan with patient and/or surrogate as well as nursing, discussions with consultants, evaluation of patient's response to treatment, examination of patient, obtaining history from patient or surrogate, ordering and performing treatments and interventions, ordering and review of laboratory  studies, ordering and review of radiographic studies, pulse oximetry and re-evaluation of patient's condition.   Eber HongBrian Shantavia Jha, MD 11/15/15 [id number]

## 2015-11-14 NOTE — Progress Notes (Signed)
ANTICOAGULATION CONSULT NOTE - Initial Consult  Pharmacy Consult for Heparin Indication: pulmonary embolus  No Known Allergies  Patient Measurements: Height: 5\' 5"  (165.1 cm) Weight: 180 lb (81.647 kg) IBW/kg (Calculated) : 57 Heparin Dosing Weight: 74.4 kg  Vital Signs: Temp: 95.4 F (35.2 C) (04/19 1930) BP: 138/88 mmHg (04/19 1930) Pulse Rate: 65 (04/19 1930)  Labs:  Recent Labs  May 18, 2016 1729 May 18, 2016 1811  HGB 10.2* 13.3  HCT 32.5* 39.0  PLT 360  --   APTT 35  --   LABPROT 16.7*  --   INR 1.34  --   CREATININE 1.33* 1.20*    Estimated Creatinine Clearance: 57.8 mL/min (by C-G formula based on Cr of 1.2).   Medical History: Past Medical History  Diagnosis Date  . Diabetes mellitus without complication (HCC)   . CHF (congestive heart failure) (HCC)   . PE (pulmonary embolism)   . MI (myocardial infarction) (HCC)       Assessment: 54yo-female presents in acute respiratory distress - encephalopathic and intubated.  PMH includes PE, DM, CHF, MI.  Diagnosed with PE and pyelonephritis one week ago, started on Xarelto on 4/18.  Pharmacy consulted to start heparin in the setting of a known PE.  On Admit: CBC wnl, INR 1.34, aPTT 35, SCr 1.20. Trp 0.02 PTA Xarelto  Goal of Therapy:  Heparin level 0.3-0.7 units/ml aPTT 66-102 seconds Monitor platelets by anticoagulation protocol: Yes   Plan:  Start heparin infusion at 1300 units/hr Check anti-Xa level in 6 hours and daily while on heparin Continue to monitor H&H and platelets  Kathlynn GrateAdam Zykiria Bruening Jul 28, 2016,8:01 PM

## 2015-11-14 NOTE — ED Notes (Signed)
CBG 89 

## 2015-11-14 NOTE — ED Notes (Signed)
Pt arrives EMS with c/o worsening resp distress with increased wob over course of day. Non productive cough. Couldn't tolerate nrb . Versed 2mg  im in route due to aggitation. Intubated on arrival.

## 2015-11-14 NOTE — Progress Notes (Signed)
Hypoglycemic Event  CBG: 52  Treatment: D50 IV 25 mL  Symptoms: None  Follow-up CBG: Time:2330 CBG Result:110  Possible Reasons for Event: Unknown  Comments/MD notified:MD Kasa, CCM    Delcie Ruppert L

## 2015-11-14 NOTE — Progress Notes (Signed)
Pt. Was transported to CT & back to A10 without any complications.

## 2015-11-14 NOTE — ED Notes (Signed)
MD notified of pts core temp. [facility].

## 2015-11-14 NOTE — Consult Note (Signed)
Neurology Consultation Reason for Consult: Stroke on HCT Referring Physician: CCM attending  CC: SOB --> incidental stroke on CT  History is obtained from chart  HPI: I am unable to obtain hx from patient as she is intubated.  There is no family at bedside.  Per chart: "55 year old female with PMH as below, which includes DM, CHF, MI, and PE. She reportedly has a recent diagnosis of MI, but was not a candidate for PCI due to three vessel disease. When she was told she would require CABG she left hospital AMA. More recently (about one week ago) she was diagnosed with PE and pyelonephritis with obstructing stone and was started on xarelto, ABX. It is unclear whether or not she has been taking this. 4/19 she presented to [facility] emergency department severely SOB. At that time she denied cough. She was intubated almost immediately on presentation. CXR for ETT placement was concerning for pulmonary edema and possible small left effusion. PCCM to admit."  A head CT in the ED demonstrated a subacute embolic appearing stroke and we are consulted.  ROS:  Unable to obtain due to altered mental status.   Past Medical History  Diagnosis Date  . Diabetes mellitus without complication (HCC)   . CHF (congestive heart failure) (HCC)   . PE (pulmonary embolism)   . MI (myocardial infarction) (HCC)      No family history on file.  Social History:  has no tobacco, alcohol, and drug history on file.  Exam: Current vital signs: BP 90/64 mmHg  Pulse 58  Temp(Src) 96.8 F (36 C)  Resp 26  Ht  (1.651 m)  Wt 81.647 kg (180 lb)  BMI 29.95 kg/m2  SpO2 100%  LMP  Vital signs in last 24 hours: Temp:  [94.8 F (34.9 C)-96.8 F (36 C)] 96.8 F (36 C) (04/19 2050) Pulse Rate:  [57-80] 58 (04/19 2050) Resp:  [16-26] 26 (04/19 2050) BP: (89-176)/(63-122) 90/64 mmHg (04/19 2050) SpO2:  [90 %-100 %] 100 % (04/19 2050) FiO2 (%):  [60 %-90 %] 90 % (04/19 1858) Weight:  [81.647 kg (180 lb)] 81.647  kg (180 lb) (04/19 1629)   Physical Exam  Constitutional: Appears well-developed and well-nourished.  Obese, intubated Psych: sedated Eyes: No scleral injection HENT: No OP obstrucion Head: Normocephalic.  Respiratory: INTUBATED AND SEDATED GI: Soft.  No distension. There is no tenderness.  Skin: WDI  Neuro: Mental Status: Patient is SEDATED AND INTUBATED - RESPONDS TO PAIN BY OPENING EYES AND ATTEMPTING TO REACH FOR THE ET TUBE. Patient is NOT ABLE TO GIVE HX No signs of aphasia or neglect Cranial Nerves: II: Visual Fields are full. Pupils are equal, round, and reactive to light. SLUGGISH III,IV, VI: TO AWAKE TO BE ABLE TO TEST DOLLS V: + TRIGEM VII: Facial movement is symmetric.  VIII: hearing is intact to voice X: Uvula elevates symmetrically XI: Shoulder shrug is symmetric. XII: tongue is midline without atrophy or fasciculations.  Motor: MOVES ALL EXT EQUALLY - HAS BKA ON RIGHT Sensory: Withdraws to pain from both arms but not from either leg - i assume 2/2 severe peripheral neuropahty Deep Tendon Reflexes: areflexic Plantars: Toes are downgoing left right bka Cerebellar: Unable to test  I have reviewed the images obtained: left frontal embolic appearing stroke  Impression: left frontal stroke in setting of concrrent PE and MI?  Unclear why patient was asked to take xarelto, but should it be afib or severe CHF, she would still be at high  risk of stroke without anticoagulation.  The only test needed at this point should be carotid doppler and will order this.  [facility], which would serve as 2ry stroke prevention for the most likely cause of her stroke which is embolism.  Tomorrow stroke attending will direct further manangement.  OK with asa for today. If she needs heparin for cardiac or other reasons this would likely be OK as the size of her stroke is relatively small, has no petechia, and is subacute at this point.  MRI has been  ordered.  Recommendations: As above

## 2015-11-14 NOTE — H&P (Signed)
PULMONARY / CRITICAL CARE MEDICINE   Name: Laurie Hoover MRN: [id number] DOB: [birth year]    ADMISSION DATE:  2015/12/01 CONSULTATION DATE:  4/19  REFERRING MD:  EDP  CHIEF COMPLAINT:  SOB  HISTORY OF PRESENT ILLNESS:  Patient is encephalopathic and/or intubated. Therefore history has been obtained from chart review. 55 year old female with PMH as below, which includes DM, CHF, MI, and PE. She reportedly has a recent diagnosis of MI, but was not a candidate for PCI due to three vessel disease. When she was told she would require CABG she left hospital AMA. More recently (about one week ago) she was diagnosed with PE and pyelonephritis with obstructing stone and was started on xarelto, ABX. It is unclear whether or not she has been taking this. 4/19 she presented to [facility] emergency department severely SOB. At that time she denied cough. She was intubated almost immediately on presentation. CXR for ETT placement was concerning for pulmonary edema and possible small left effusion. PCCM to admit.   PAST MEDICAL HISTORY :  She  has a past medical history of Diabetes mellitus without complication (HCC); CHF (congestive heart failure) (HCC); PE (pulmonary embolism); and MI (myocardial infarction) (HCC).  PAST SURGICAL HISTORY: She  has past surgical history that includes Below knee leg amputation.  No Known Allergies  No current facility-administered medications on file prior to encounter.   No current outpatient prescriptions on file prior to encounter.    FAMILY HISTORY:  Her has no family status information on file.   SOCIAL HISTORY: She    REVIEW OF SYSTEMS:   unable  SUBJECTIVE:   VITAL SIGNS: BP 176/101 mmHg  Pulse 73  Temp(Src) 95 F (35 C)  Resp 20  Ht  (1.651 m)  Wt 81.647 kg (180 lb)  BMI 29.95 kg/m2  SpO2 95%  LMP   HEMODYNAMICS:    VENTILATOR SETTINGS: Vent Mode:  [-] PRVC FiO2 (%):  [60 %-70 %] 70 % Set Rate:  [16 bmp-20 bmp] 20 bmp Vt Set:  [440  mL-500 mL] 440 mL PEEP:  [5 cmH20] 5 cmH20 Plateau Pressure:  [25 cmH20] 25 cmH20  INTAKE / OUTPUT:    PHYSICAL EXAMINATION: General:  Obese female in vent Neuro:  Sedated on vent HEENT:  [place]/AT, PERRL, No JVD noted Cardiovascular:  RRR, no MRG Lungs:  Coarse Abdomen:  Soft, non-distended Musculoskeletal:  R BKA Skin:  Grossly intact  LABS:  BMET  Recent Labs Lab 01-Dec-2015 1729 Dec 01, 2015 1811  NA 138 139  K 4.8 4.8  CL 104 103  CO2 21*  --   BUN 30* 34*  CREATININE 1.33* 1.20*  GLUCOSE 98 88    Electrolytes  Recent Labs Lab 2015/12/01 1729  CALCIUM 8.4*    CBC  Recent Labs Lab 12/01/15 1729 12/01/15 1811  WBC 9.3  --   HGB 10.2* 13.3  HCT 32.5* 39.0  PLT 360  --     Coag's  Recent Labs Lab 2015/12/01 1729  APTT 35  INR 1.34    Sepsis Markers  Recent Labs Lab Dec 01, 2015 1812  LATICACIDVEN 2.44*    ABG  Recent Labs Lab 12/01/2015 1718 2015-12-01 1831  PHART 7.262* 7.293*  PCO2ART 56.3* 53.4*  PO2ART 64.0* 51.0*    Liver Enzymes  Recent Labs Lab 12-01-2015 1729  AST 89*  ALT 55*  ALKPHOS 143*  BILITOT 0.7  ALBUMIN 2.9*    Cardiac Enzymes No results for input(s): TROPONINI, PROBNP in the last 168 hours.  Glucose No results for input(s): GLUCAP in the last 168 hours.  Imaging Dg Chest Portable 1 View  11/20/2015  CLINICAL DATA:  Endotracheal tube placement. Worsening shortness of breath today. EXAM: PORTABLE CHEST 1 VIEW COMPARISON:  None. FINDINGS: The endotracheal tube is 5 cm above the carina. The NG tube is coursing down the esophagus and into the stomach. The heart is borderline in size given the supine position the patient in the AP projection. Diffuse interstitial and airspace process is likely pulmonary edema. IMPRESSION: Endotracheal tube in good position, 5 cm above the carina. Pulmonary edema and possible small left pleural effusion. Electronically Signed   By: Rudie Meyer M.D.   On: Nov 20, 2015 17:15   Dg Knee Left  Port  11/20/15  CLINICAL DATA:  Fall on tile floor last night. Anterior knee swelling above the patella. EXAM: PORTABLE LEFT KNEE - 1-2 VIEW COMPARISON:  None. 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 Vascular calcifications also noted. Abnormal thickening of the prepatellar subcutaneous tissues suggesting prepatellar bursitis. No knee effusion. Probably degenerative osteochondral lesion along the posterior patellar ridge superiorly. Marginal articular spurring in the patella along with spurring along the patellar tendon attachment site. IMPRESSION: 1. Prominent prepatellar soft tissue swelling suggesting prepatellar bursitis. 2. Mild degenerative marginal articular spurring along the patella with a degenerative osteochondral lesion superiorly along the patellar articular surface. 3. 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 Electronically Signed   By: Gaylyn Rong M.D.   On: 2015/11/20 18:15     STUDIES:  CTA chest 4/19 >  CT head 4/19 >  CULTURES: n/a  ANTIBIOTICS: none  SIGNIFICANT EVENTS: 4/19 admit, intuabted  LINES/TUBES: ETT 4/19 >>>  DISCUSSION: 54 year old female with recent multivessel CAD and PE diagnosis. Refused CABG. Now presenting with resp failure due to pulmonary edema. On vent.   ASSESSMENT / PLAN:  PULMONARY A: Acute hypoxemic/hypercarbic respiratory failure in setting of pulmonary edema with known PE.   P:   Full vent support PRVC 8cc/kg Follow ABG Vent bundle  CARDIOVASCULAR A:  Hypertensive emergency Known 3 vessel CAD Acute on chronic CHF Elevated lactic, likely due to increased WOB Recent MI  P:  Telemetry MAP goal ~ 90 first 24 hours, then SBP goal < 160 Resume home amlodipine for now Hold home metoprolol until UDS clear of cocaine Trend troponin Repeat lactic Echo Diurese  RENAL A:   Kidney injury, unknown  chronicity  Recent pyelo with obstructing stone  P:   KVO IVF Follow BMP Renal US  GASTROINTESTINAL A:   No acute issues  P:   NPO Protonix  HEMATOLOGIC A:   Anemia, unknown basline Pulmonary embolism (on xarelto)  P: Heparin infusion per pharmacy Follow CBC, coags  INFECTIOUS A:   Recent pyelo and hydronephrosis completed ABX  P:   Monitor WBC and fever curve  ENDOCRINE A:   DM  P:   CBG monitoring and SSI  NEUROLOGIC A:   Acute metabolic encephalopathy ?Seizure history Subacute ischemia on CT head  P:   RASS goal: -1 Fentanyl infusion PRN versed Continue home keppra Consult neurology  FAMILY  - Updates: LCB, no CPR or defib per discussion with mother. Patient has been refusing all therapies and has no desire to get better. Family knows that any resuscitation efforts even if successful would significantly worsen her level of health.   - Inter-disciplinary family meet or Palliative Care meeting due by:  4/26  Joneen RoachPaul Hoffman, AGACNP-BC [facility] Pager [phone] or [phone]  11/06/2015 7:08 PM  Attending Note:  55 year old female with 3 vessel CAD who refused CABG and was not a PCI candidate who presents with acute pulmonary edema to the ED and was immediately intubated and placed on the vent.  Patient also had a recent treatement for pyelo and is on xarelto for a PE.  Patient also had poor mental status and neurology was called who ordered and MRI.  On exam, the patient withdraws ext to pain and neurology changed to propofol infusion.  Unfortunately, that caused hemodynamic compromise and low dose levo was started.  Also, peripheral CBG has been low, will ask to check glucose on a blood draw as I believe patient has poor circulation but continue to treat CBGs for now anyway.  Hold off abx for now.  I reviewed head CT myself, large area of low attentuation on the left frontal.  MRI pending.  Will keep sedate for today and  if remains hemodynamically compromised then will need to consider TLC placement.  The patient is critically ill with multiple organ systems failure and requires high complexity decision making for assessment and support, frequent evaluation and titration of therapies, application of advanced monitoring technologies and extensive interpretation of multiple databases.   Critical Care Time devoted to patient care services described in this note is  35  Minutes. This time reflects time of care of this signee Dr Koren BoundWesam Yacoub. This critical care time does not reflect procedure time, or teaching time or supervisory time of PA/NP/Med student/Med Resident etc but could involve care discussion time.  Alyson ReedyWesam G. Yacoub, M.D. [facility]. Pager: [phone]. After hours pager: [phone].

## 2015-11-14 NOTE — Progress Notes (Signed)
eLink Physician-Brief Progress Note Patient Name: Laurie Hoover DOB: 1961/02/27 MRN: [id number]   Date of Service  08-11-2015  HPI/Events of Note  Low BP  eICU Interventions  norepinephrine ordered     Intervention Category Major Interventions: Hypotension - evaluation and management  Erin FullingKurian Chasmine Lender 08-11-2015, 11:14 PM

## 2015-11-14 NOTE — ED Notes (Addendum)
Attempted to call report

## 2015-11-14 NOTE — ED Notes (Signed)
Core temp obtained by temp foley.

## 2015-11-14 NOTE — ED Notes (Signed)
Renae FicklePaul NP at bedside

## 2015-11-15 ENCOUNTER — Encounter (HOSPITAL_COMMUNITY): Payer: Self-pay

## 2015-11-15 ENCOUNTER — Inpatient Hospital Stay (HOSPITAL_COMMUNITY): Payer: Self-pay

## 2015-11-15 DIAGNOSIS — I679 Cerebrovascular disease, unspecified: Secondary | ICD-10-CM

## 2015-11-15 DIAGNOSIS — F1910 Other psychoactive substance abuse, uncomplicated: Secondary | ICD-10-CM | POA: Diagnosis present

## 2015-11-15 DIAGNOSIS — I639 Cerebral infarction, unspecified: Secondary | ICD-10-CM | POA: Diagnosis present

## 2015-11-15 DIAGNOSIS — I25810 Atherosclerosis of coronary artery bypass graft(s) without angina pectoris: Secondary | ICD-10-CM | POA: Diagnosis present

## 2015-11-15 DIAGNOSIS — L8990 Pressure ulcer of unspecified site, unspecified stage: Secondary | ICD-10-CM | POA: Diagnosis present

## 2015-11-15 DIAGNOSIS — J9600 Acute respiratory failure, unspecified whether with hypoxia or hypercapnia: Secondary | ICD-10-CM

## 2015-11-15 DIAGNOSIS — Z789 Other specified health status: Secondary | ICD-10-CM

## 2015-11-15 DIAGNOSIS — J810 Acute pulmonary edema: Secondary | ICD-10-CM

## 2015-11-15 LAB — RAPID URINE DRUG SCREEN, HOSP PERFORMED
AMPHETAMINES: NOT DETECTED
BARBITURATES: NOT DETECTED
BENZODIAZEPINES: POSITIVE — AB
Cocaine: NOT DETECTED
Opiates: NOT DETECTED
TETRAHYDROCANNABINOL: NOT DETECTED

## 2015-11-15 LAB — GLUCOSE, CAPILLARY
GLUCOSE-CAPILLARY: 15 mg/dL — AB (ref 65–99)
GLUCOSE-CAPILLARY: 303 mg/dL — AB (ref 65–99)
GLUCOSE-CAPILLARY: 248 mg/dL — AB (ref 65–99)
GLUCOSE-CAPILLARY: 57 mg/dL — AB (ref 65–99)
GLUCOSE-CAPILLARY: 74 mg/dL (ref 65–99)
GLUCOSE-CAPILLARY: 74 mg/dL (ref 65–99)
Glucose-Capillary: 112 mg/dL — ABNORMAL HIGH (ref 65–99)
Glucose-Capillary: 250 mg/dL — ABNORMAL HIGH (ref 65–99)
Glucose-Capillary: 183 mg/dL — ABNORMAL HIGH (ref 65–99)

## 2015-11-15 LAB — APTT: aPTT: 153 seconds — ABNORMAL HIGH (ref 24–37)

## 2015-11-15 LAB — BASIC METABOLIC PANEL
ANION GAP: 12 (ref 5–15)
BUN: 31 mg/dL — ABNORMAL HIGH (ref 6–20)
CALCIUM: 8.1 mg/dL — AB (ref 8.9–10.3)
CHLORIDE: 102 mmol/L (ref 101–111)
CO2: 20 mmol/L — AB (ref 22–32)
Creatinine, Ser: 1.46 mg/dL — ABNORMAL HIGH (ref 0.44–1.00)
GFR calc non Af Amer: 40 mL/min — ABNORMAL LOW (ref >60)
GFR, EST AFRICAN AMERICAN: 47 mL/min — AB (ref >60)
Glucose, Bld: 242 mg/dL — ABNORMAL HIGH (ref 65–99)
POTASSIUM: 4.2 mmol/L (ref 3.5–5.1)
Sodium: 134 mmol/L — ABNORMAL LOW (ref 135–145)

## 2015-11-15 LAB — CBC
HCT: 29.1 % — ABNORMAL LOW (ref 36.0–46.0)
HEMOGLOBIN: 9.6 g/dL — AB (ref 12.0–15.0)
MCH: 28.9 pg (ref 26.0–34.0)
MCHC: 33 g/dL (ref 30.0–36.0)
MCV: 87.7 fL (ref 78.0–100.0)
Platelets: 247 10*3/uL (ref 150–400)
RBC: 3.32 MIL/uL — AB (ref 3.87–5.11)
RDW: 15.1 % (ref 11.5–15.5)
WBC: 9.5 10*3/uL (ref 4.0–10.5)

## 2015-11-15 LAB — TROPONIN I: Troponin I: 0.04 ng/mL — ABNORMAL HIGH (ref <0.031)

## 2015-11-15 LAB — MRSA PCR SCREENING: MRSA BY PCR: NEGATIVE

## 2015-11-15 LAB — MAGNESIUM: Magnesium: 2.1 mg/dL (ref 1.7–2.4)

## 2015-11-15 LAB — LACTIC ACID, PLASMA: LACTIC ACID, VENOUS: 1.2 mmol/L (ref 0.5–2.0)

## 2015-11-15 LAB — HEPARIN LEVEL (UNFRACTIONATED): HEPARIN UNFRACTIONATED: 1.28 [IU]/mL — AB (ref 0.30–0.70)

## 2015-11-15 LAB — PHOSPHORUS: PHOSPHORUS: 5 mg/dL — AB (ref 2.5–4.6)

## 2015-11-15 LAB — GLUCOSE, RANDOM: Glucose, Bld: 244 mg/dL — ABNORMAL HIGH (ref 65–99)

## 2015-11-15 MED ORDER — ENOXAPARIN SODIUM 80 MG/0.8ML ~~LOC~~ SOLN
75.0000 mg | Freq: Two times a day (BID) | SUBCUTANEOUS | Status: DC
  Administered 2015-11-15 – 2015-11-16 (×2): 75 mg via SUBCUTANEOUS
  Filled 2015-11-15 (×3): qty 0.75

## 2015-11-15 MED ORDER — PRO-STAT SUGAR FREE PO LIQD
30.0000 mL | Freq: Two times a day (BID) | ORAL | Status: DC
  Administered 2015-11-15 – 2015-11-16 (×2): 30 mL
  Filled 2015-11-15 (×2): qty 30

## 2015-11-15 MED ORDER — VITAL HIGH PROTEIN PO LIQD: 1000.0000 mL | ORAL | Status: DC

## 2015-11-15 MED ORDER — VITAL AF 1.2 CAL PO LIQD: 1000.0000 mL | ORAL | Status: DC

## 2015-11-15 MED ORDER — CHLORHEXIDINE GLUCONATE 0.12% ORAL RINSE (MEDLINE KIT)
15.0000 mL | Freq: Two times a day (BID) | OROMUCOSAL | Status: DC
  Administered 2015-11-15 – 2015-11-16 (×4): 15 mL via OROMUCOSAL

## 2015-11-15 MED ORDER — DEXTROSE 50 % IV SOLN
INTRAVENOUS | Status: AC
Start: 2015-11-15 — End: 2015-11-15
  Administered 2015-11-15: 50 mL
  Filled 2015-11-15: qty 50

## 2015-11-15 MED ORDER — ANTISEPTIC ORAL RINSE SOLUTION (CORINZ)
7.0000 mL | Freq: Four times a day (QID) | OROMUCOSAL | Status: DC
  Administered 2015-11-15 – 2015-11-16 (×5): 7 mL via OROMUCOSAL

## 2015-11-15 MED ORDER — GADOBENATE DIMEGLUMINE 529 MG/ML IV SOLN
15.0000 mL | Freq: Once | INTRAVENOUS | Status: AC | PRN
  Administered 2015-11-15: 15 mL via INTRAVENOUS

## 2015-11-15 MED ORDER — DEXTROSE 50 % IV SOLN
INTRAVENOUS | Status: AC
  Administered 2015-11-15: 25 mL
  Filled 2015-11-15: qty 50

## 2015-11-15 MED ORDER — DEXTROSE 10 % IV SOLN
INTRAVENOUS | Status: DC
  Administered 2015-11-15 – 2015-11-16 (×3): via INTRAVENOUS

## 2015-11-15 MED ORDER — LEVETIRACETAM 100 MG/ML PO SOLN
500.0000 mg | Freq: Two times a day (BID) | ORAL | Status: DC
  Administered 2015-11-15 – 2015-11-16 (×3): 500 mg
  Filled 2015-11-15 (×5): qty 5

## 2015-11-15 NOTE — Progress Notes (Signed)
eLink Physician-Brief Progress Note Patient Name: Laurie Hoover DOB: [birth year] MRN: [id number]   Date of Service  11/15/2015  HPI/Events of Note  Patient is on a Heparin IV infusion. Now DNR and labs D/Ced. Pharmacy requests change to Lovenox [place].   eICU Interventions  Will order: 1. D/C Heparin IV infusion. 2. Lovenox [place].     Intervention Category Intermediate Interventions: Other:  Lenell AntuSommer,Muriel Hannold Eugene 11/15/2015, 3:30 PM

## 2015-11-15 NOTE — Progress Notes (Signed)
eLink Physician-Brief Progress Note Patient Name: [name] DOB: 1960/09/19 MRN: [id number]   Date of Service  11/15/2015  HPI/Events of Note  Hypoglycemia - Blood glucose = 57.   eICU Interventions  Will increase D10W IV infusion to 125 mL/hour.      Intervention Category Major Interventions: Other:  Lenell AntuSommer,Steven Eugene 11/15/2015, 4:42 PM

## 2015-11-15 NOTE — Progress Notes (Addendum)
Initial Nutrition Assessment  DOCUMENTATION CODES:   Not applicable  INTERVENTION:   Initiate Vital AF 1.2 formula at 25 ml/hr and increase by 10 ml every 4 hours to goal rate of 45 ml/hr    Prostat liquid protein 30 ml BID via tube  Above TF regimen + current Propofol infusion will provide 1733 total kcals, 111 gm protein, 876 ml of free water daily  NUTRITION DIAGNOSIS:   Inadequate oral intake related to inability to eat as evidenced by NPO status  GOAL:   Patient will meet greater than or equal to 90% of their needs  MONITOR:   Vent status, TF tolerance, Labs, Weight trends, I & O's  REASON FOR ASSESSMENT:   Consult Enteral/tube feeding initiation and management  ASSESSMENT:   55 yo Female with PMH of DM, CHF, MI, and PE. She reportedly has a recent diagnosis of MI, but was not a candidate for PCI due to three vessel disease. When she was told she would require CABG she left hospital AMA. More recently (about one week ago) she was diagnosed with PE and pyelonephritis with obstructing stone and was started on xarelto, ABX. It is unclear whether or not she has been taking this. [facility]. At that time she denied cough. She was intubated almost immediately on presentation. CXR for ETT placement was concerning for pulmonary edema and possible small left effusion.    A head CT in the ED demonstrated a subacute embolic appearing stroke and Neurology was consulted.  Patient is currently intubated on ventilator support -- OGT in place MV: 11.8 L/min Temp (24hrs), Avg:96.7 F (35.9 C), Min:93.7 F (34.3 C), Max:99.3 F (37.4 C)   Propofol: 9 ml/hr ----> 237 fat kcals  CCM note reviewed.  Pt with hypertensive emergency.  Known 3 vessel CAD.  Now with acute hypoxemic respiratory failure.  RD unable to complete Nutrition Focused Physical Exam at this time.  Diet Order:  Diet NPO time specified  Skin:  Reviewed, no  issues  Last BM:  N/A  Height:   Ht Readings from Last 1 Encounters:  October 29, 2015 5\' 5"  (1.651 m)    Weight:   Wt Readings from Last 1 Encounters:  11/15/15 164 lb 0.4 oz (74.4 kg)    Ideal Body Weight:  56.8 kg  BMI:  28.9 kg/m2 -- adjusted for BKA   Estimated Nutritional Needs:   Kcal:  1665  Protein:  110-120 gm  Fluid:  per MD  EDUCATION NEEDS:   No education needs identified at this time  Maureen ChattersKatie Yatziry Deakins, RD, LDN Pager #: [phone] After-Hours Pager #: [phone]

## 2015-11-15 NOTE — Progress Notes (Signed)
eLink Physician-Brief Progress Note Patient Name: Nelia ShiKimrey Steege DOB: 25-Mar-1961 MRN: [id number]   Date of Service  11/15/2015  HPI/Events of Note  Severe hypoglycemia  eICU Interventions  Start d10 infusion at 6575ml/hr     Intervention Category Major Interventions: Other:  Erin FullingKurian Solenne Manwarren 11/15/2015, 4:10 AM

## 2015-11-15 NOTE — Progress Notes (Signed)
Hypoglycemic Event  CBG: 57  Treatment: dextose 50% 25cc  Symptoms: none   Follow-up CBG: Time:1640 CBG Result:74  Possible Reasons for Event: NPO  Comments/MD notified: MD notified, D10 increased to 13925ml/hr    Louie BunWilson,Shylo Zamor S 5:18 PM

## 2015-11-15 NOTE — Progress Notes (Addendum)
ANTICOAGULATION CONSULT NOTE - Follow Up Consult  Pharmacy Consult for Heparin>>Lovenox Indication: pulmonary embolus  No Known Allergies  Patient Measurements: Height: 5\' 5"  (165.1 cm) Weight: 164 lb 0.4 oz (74.4 kg) IBW/kg (Calculated) : 57 Heparin Dosing Weight:    Vital Signs: Temp: 99.3 F (37.4 C) (04/20 1400) BP: 109/60 mmHg (04/20 1400) Pulse Rate: 76 (04/20 1400)  Labs:  Recent Labs  11/30/15 1729 11/30/15 1811 11/30/15 2016 11/30/15 2317 11/15/15 0720 11/15/15 0730  HGB 10.2* 13.3  --   --  9.6*  --   HCT 32.5* 39.0  --   --  29.1*  --   PLT 360  --   --   --  247  --   APTT 35  --   --   --  153*  --   LABPROT 16.7*  --   --  17.1*  --   --   INR 1.34  --   --  1.38  --   --   HEPARINUNFRC  --   --  0.82*  --  1.28*  --   CREATININE 1.33* 1.20*  --   --   --  1.46*  TROPONINI  --   --   --  0.05*  --  0.04*    Estimated Creatinine Clearance: 45.5 mL/min (by C-G formula based on Cr of 1.46).   Assessment: 54yo-female presents in acute respiratory distress - encephalopathic and intubated.  Diagnosed with PE and pyelonephritis with obstructing stone one week ago, started on Xarelto on 4/18.Incidental stroke found on CT.  PMH includes PE, DM, CHF, recent MI>3VCAD (when told she would need CABG, left AMA), recent PE, BKA, CHF  On Admit: CBC wnl, INR 1.34, aPTT 35, SCr 1.20. Trp 0.02  AC: Recent PE (1 wk ago) on Xarelto x 1st and only dose 4/18. HL 0.82>1.28 (expected due to interaction with Xarelto). APTT (35 baseline) 153 high. Hgb 10.2>9.6. Plts 360>247 watch.  Goal of Therapy:  aPTT 66-102 seconds Monitor platelets by anticoagulation protocol: Yes   Plan:  Decrease IV heparin to 1150 units/hr--done this AM 1000. Recheck aPTT in 6 hrs--d/c'd  --Since all labs and monitoring have been discontinued, Dr. Arsenio LoaderSommer ok's switching patient to Lovenox which does not require as much labs monitoring per protocol.    Burnham Trost S. Merilynn Finlandobertson, PharmD,  BCPS Clinical Staff Pharmacist Pager [phone]  Misty Stanleyobertson, Kynzie Polgar Stillinger 11/15/2015,3:14 PM

## 2015-11-15 NOTE — Progress Notes (Signed)
STROKE TEAM PROGRESS NOTE   HISTORY OF PRESENT ILLNESS Dr. Cena BentonVega was unable to obtain hx from patient as she is intubated. There is no family at bedside. Per chart: "55 year old female with PMH as below, which includes DM, CHF, MI, and PE. She reportedly has a recent diagnosis of MI, but was not a candidate for PCI due to three vessel disease. When she was told she would require CABG she left hospital AMA. More recently (about one week ago) she was diagnosed with PE and pyelonephritis with obstructing stone and was started on xarelto, ABX. It is unclear whether or not she has been taking this. 4/19 she [facility] to [facility] department severely SOB. At that time she denied cough. She was intubated almost immediately on presentation. CXR for ETT placement was concerning for pulmonary edema and possible small left effusion. PCCM to admit." A head CT in the ED demonstrated a subacute embolic appearing stroke and neuro was consulted. Patient was not administered IV t-PA.    SUBJECTIVE (INTERVAL HISTORY) Her mother is at the bedside. I have personally taken history from the patient's mother about her medical condition.Patient recently moved from [place] to [place] to be near her mother. Has a reported hx of seizure but no stroke.  She has been on Keppra.She has not had any witnessed seizures since admission or focal neurological deficits.   OBJECTIVE Temp:  [93.7 F (34.3 C)-99 F (37.2 C)] 96.4 F (35.8 C) (04/20 1000) Pulse Rate:  [47-80] 62 (04/20 1000) Cardiac Rhythm:  [-] Sinus bradycardia (04/20 0800) Resp:  [13-27] 20 (04/20 1000) BP: (77-178)/(53-122) 120/70 mmHg (04/20 1000) SpO2:  [90 %-100 %] 100 % (04/20 1000) FiO2 (%):  [50 %-100 %] 50 % (04/20 0800) Weight:  [74.4 kg (164 lb 0.4 oz)-81.647 kg (180 lb)] 74.4 kg (164 lb 0.4 oz) (04/20 0300)  CBC:   Recent Labs Lab 10/31/2015 1729 11/11/2015 1811 11/15/15 0720  WBC 9.3  --  9.5  HGB 10.2* 13.3 9.6*  HCT 32.5* 39.0 29.1*   MCV 89.5  --  87.7  PLT 360  --  247    Basic Metabolic Panel:   Recent Labs Lab 10/29/2015 1729 11/07/2015 1811 11/20/2015 2317 11/15/15 0720 11/15/15 0730  NA 138 139  --   --  134*  K 4.8 4.8  --   --  4.2  CL 104 103  --   --  102  CO2 21*  --   --   --  20*  GLUCOSE 98 88  --  244* 242*  BUN 30* 34*  --   --  31*  CREATININE 1.33* 1.20*  --   --  1.46*  CALCIUM 8.4*  --   --   --  8.1*  MG  --   --  2.1  --  2.1  PHOS  --   --  5.1*  --  5.0*    Lipid Panel:     Component Value Date/Time   CHOL 117 11/02/2015 2317   TRIG 23 10/27/2015 2317   HDL 51 11/11/2015 2317   CHOLHDL 2.3 11/24/2015 2317   VLDL 5 11/21/2015 2317   LDLCALC 61 11/07/2015 2317   HgbA1c: No results found for: HGBA1C Urine Drug Screen:     Component Value Date/Time   LABOPIA NONE DETECTED 11/21/2015 0056   COCAINSCRNUR NONE DETECTED 11/24/2015 0056   LABBENZ POSITIVE* 10/30/2015 0056   AMPHETMU NONE DETECTED 11/19/2015 0056   THCU NONE DETECTED 10/30/2015  0056   LABBARB NONE DETECTED 11/24/15 0056      IMAGING  Ct Head Wo Contrast 2015/11/24  1. Large area of low attenuation in the left frontal lobe concerning for an area of subacute ischemia. Alternatively, a mass with surrounding edema is not excluded, and further evaluation with brain MRI with and without IV gadolinium is recommended in the near future. 2. No acute abnormality of the cervical spine.  Mr Laqueta Jean Wo Contrast 11/15/2015   No acute intracranial process, specifically no acute ischemia or mass. 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 Old suspected RIGHT thalamus lacunar infarcts.    PHYSICAL EXAM Middle-aged African-American lady who is intubated and sedated .She has a right below-knee amputation. . Afebrile. Head is nontraumatic. Neck  is supple without bruit.    Cardiac exam no murmur or gallop. Lungs are clear to auscultation. Distal pulses are well felt except in right lower extremity. Neurological Exam ; Limited due to sedation. Eyes are closed. Pupils 2 mm reactive. Fundi were not visualized. Face is symmetric. Tongue is midline. Motor system exam withdraws purposefully All 4 extremities. Right leg has below-knee amputation. ASSESSMENT/PLAN Ms. Clarie Camey is a 55 y.o. female with history of DM, CHF, recent MI with CABG recommended and even more recent PE with pyelonephritis from obstructing stone placed on abx and xarelto presenting with severe SOB, intubated in ED. CT showed subacute embolic stroke. She did not receive IV t-PA.   Abnormal CT findings No acute infarct on MRI Encephalomalacia with gliosis bilateral frontal and left occipital from old infarcts  History of silent strokes  CT remote damage which could be from a stroke, no known distory  MRI  No acute stroke  Carotid Doppler  Canceled  CTA head and neck ordered  2D Echo bubble, changed to regular echo pending   LDL 61  HgbA1c pending  IV heparin for VTE prophylaxis Diet NPO time specified  Xarelto (rivaroxaban) daily ordered prior to admission but unclear if she has been taking , now on heparin IV  Therapy recommendations:  pending   Disposition:  pending   Dr. Pearlean Brownie spoke with DR. Vassie Loll - ? Goals of care. If palliative care is the goal, ok to cancel above tests  Respiratory failure  Intubated  Hypertensive Emergency  Unstable  Started on norepinephrine over night for hypotension  BP stable currently  Hyperlipidemia  Home meds:  lipitor 80  LDL 61, goal < 70  Resume lipitor when able    Continue statin at discharge  Diabetes  HgbA1c pending, goal < 7.0  Uncontrolled  Severe hypoglycemia during the night, started on D10   Other Stroke Risk Factors  Overweight, Body mass index is 27.29 kg/(m^2).   Coronary artery  disease - recent MI with CABG recommended [facility])  Seizure hx  Most likely secondary to prior strokes  On keppra  Other Active Problems  Recent PE started on xarelto Claris Gower)  Recent pyelo with obstructing stone  Acute on chronic CHF  R BKA  Patient left AMA after CABG recommended. She has been refusing all therapies.   Hospital day # 1  Rhoderick Moody [facility] See Amion for Pager information 11/15/2015 10:40 AM  I have personally examined this patient, reviewed notes, independently viewed imaging studies, participated in medical decision making and plan of care. I have made any additions  or clarifications directly to the above note. Agree with note above. She was admitted for respiratory difficulty and intubated. There was no clear documented Seizure or focal neurological symptoms suggestive of stroke. Imaging studies suggest hold by cerebral  Infarcts with  Gliosis. Etiology of the stroke is likely cardioembolic given her cardiac condition and history. She is at risk for neurological worsening, recurrent strokes, TIA, seizures and recommend continue Keppra for seizure prophylaxis. Further evaluation for stroke including CT angiogram, echocardiogram,, lipid profile and hemoglobin A1c.She will need to stay on long-term anticoagulation for history of pulmonary embolism. I had a long 25 minute discussion with Patient's mother at the bedside as well as with Dr. Vassie Loll about her condition, neurological evaluation, treatment for seizures, strokes prevention and answered questions.  Delia Heady, MD Medical Director [facility] Pager: [phone] 11/15/2015 3:17 PM    To contact Stroke Continuity provider, please refer to [email or web address]. After hours, contact General Neurology

## 2015-11-15 NOTE — Progress Notes (Signed)
Per RN, MD wants to attempt vent wean pt. Changed pt to cpap/psv.  PS was attempted 5-20, pt very agitated at times kicking and flailing arms around w/ RR in 40's.  MD notified, per MD no plans to extubate at this time, pt placed back on full vent support d/t RN re-sedating pt.

## 2015-11-15 NOTE — Progress Notes (Signed)
PULMONARY / CRITICAL CARE MEDICINE   Name: Laurie Hoover MRN: [id number] DOB: [birth year]    ADMISSION DATE:  11/15/2015 CONSULTATION DATE:  4/19  REFERRING MD:  EDP  CHIEF COMPLAINT:  SOB  HISTORY OF PRESENT ILLNESS:  Patient is encephalopathic and/or intubated. Therefore history has been obtained from chart review. 55 year old female with PMH as below, which includes DM, CHF, MI, and PE. She reportedly has a recent diagnosis of MI, but was not a candidate for PCI due to three vessel disease. When she was told she would require CABG she left hospital AMA. More recently (about one week ago) she was diagnosed with PE and pyelonephritis with obstructing stone and was started on xarelto, ABX. It is unclear whether or not she has been taking this. 4/19 she presented to [facility] emergency department severely SOB. At that time she denied cough. She was intubated almost immediately on presentation. CXR for ETT placement was concerning for pulmonary edema and possible small left effusion. PCCM to admit.    SUBJECTIVE:  Agitated on WUA No obvious CP, dyspnea Sedated for MRI  VITAL SIGNS: BP 120/70 mmHg  Pulse 62  Temp(Src) 96.4 F (35.8 C)  Resp 20  Ht  (1.651 m)  Wt 164 lb 0.4 oz (74.4 kg)  BMI 27.29 kg/m2  SpO2 100%  LMP   HEMODYNAMICS:    VENTILATOR SETTINGS: Vent Mode:  [-] PRVC FiO2 (%):  [40 %-100 %] 40 % Set Rate:  [16 bmp-26 bmp] 26 bmp Vt Set:  [440 mL-500 mL] 440 mL PEEP:  [5 cmH20-8 cmH20] 5 cmH20 Plateau Pressure:  [21 cmH20-25 cmH20] 21 cmH20  INTAKE / OUTPUT: I/O last 3 completed shifts: In: 571.4 [I.V.:511.4; NG/GT:60] Out: 1145 [Urine:1145]  PHYSICAL EXAMINATION: General:  Obese female in vent Neuro:  Sedated on vent HEENT:  [place]/AT, PERRL, No JVD noted Cardiovascular:  RRR, no MRG Lungs:  Coarse Abdomen:  Soft, non-distended Musculoskeletal:  R BKA Skin:  Grossly intact  LABS:  BMET  Recent Labs Lab 10/27/2015 1729 10/29/2015 1811 11/15/15 0720  11/15/15 0730  NA 138 139  --  134*  K 4.8 4.8  --  4.2  CL 104 103  --  102  CO2 21*  --   --  20*  BUN 30* 34*  --  31*  CREATININE 1.33* 1.20*  --  1.46*  GLUCOSE 98 88 244* 242*    Electrolytes  Recent Labs Lab 11/22/2015 1729 11/22/2015 2317 11/15/15 0730  CALCIUM 8.4*  --  8.1*  MG  --  2.1 2.1  PHOS  --  5.1* 5.0*    CBC  Recent Labs Lab 10/27/2015 1729 10/28/2015 1811 11/15/15 0720  WBC 9.3  --  9.5  HGB 10.2* 13.3 9.6*  HCT 32.5* 39.0 29.1*  PLT 360  --  247    Coag's  Recent Labs Lab 10/28/2015 1729 11/19/2015 2317 11/15/15 0720  APTT 35  --  153*  INR 1.34 1.38  --     Sepsis Markers  Recent Labs Lab 11/24/2015 1812 11/21/2015 2317 11/15/15 0730  LATICACIDVEN 2.44* 1.1 1.2    ABG  Recent Labs Lab 10/30/2015 1718 10/31/2015 1831 11/11/2015 2108  PHART 7.262* 7.293* 7.408  PCO2ART 56.3* 53.4* 37.9  PO2ART 64.0* 51.0* 55.0*    Liver Enzymes  Recent Labs Lab 11/18/2015 1729  AST 89*  ALT 55*  ALKPHOS 143*  BILITOT 0.7  ALBUMIN 2.9*    Cardiac Enzymes  Recent Labs Lab 10/27/2015 2317 11/15/15  0730  TROPONINI 0.05* 0.04*    Glucose  Recent Labs Lab 2015-11-28 2333 11/15/15 0401 11/15/15 0403 11/15/15 0416 11/15/15 0720 11/15/15 0813  GLUCAP 110* 12* 15* 112* 303* 248*    Imaging Ct Head Wo Contrast  2015/11/28  CLINICAL DATA:  55 year old female found down by family unresponsive. EXAM: CT HEAD WITHOUT CONTRAST CT CERVICAL SPINE WITHOUT CONTRAST TECHNIQUE: Multidetector CT imaging of the head and cervical spine was performed following the standard protocol without intravenous contrast. Multiplanar CT image reconstructions of the cervical spine were also generated. COMPARISON:  No priors. FINDINGS: CT HEAD FINDINGS Intubated patient. Orogastric tube in position. Low-attenuation in the left frontal region involving the gray matter, subcortical white matter and to underlying white matter, best appreciated on image 24 of series 2. No acute  intra cerebral hemorrhage. No hydrocephalus. No shift of midline structures. Basal cisterns are patent. No acute displaced skull fractures. Visualized paranasal sinuses and mastoids are well pneumatized. CT CERVICAL SPINE FINDINGS No acute displaced fractures of the cervical spine. Alignment is anatomic. Prevertebral soft tissues cannot be evaluated secondary to the indwelling endotracheal and orogastric tubes. Visualized portions of the upper thorax demonstrate large bilateral pleural effusions lying dependently. IMPRESSION: 1. Large area of low attenuation in the left frontal lobe concerning for an area of subacute ischemia. Alternatively, a mass with surrounding edema is not excluded, and further evaluation with brain MRI with and without IV gadolinium is recommended in the near future. 2. No acute abnormality of the cervical spine. Critical Value/emergent results were called by telephone at the time of interpretation on 2015/11/28 at 7:41 pm to Dr. Eber Hong, who verbally acknowledged these results. Electronically Signed   By: Trudie Reed M.D.   On: 11-28-2015 19:44   Ct Angio Chest Pe W/cm &/or Wo Cm  2015/11/28  CLINICAL DATA:  Found unresponsive by family. EXAM: CT ANGIOGRAPHY CHEST CT ABDOMEN AND PELVIS WITH CONTRAST TECHNIQUE: Multidetector CT imaging of the chest was performed using the standard protocol during bolus administration of intravenous contrast. Multiplanar CT image reconstructions and MIPs were obtained to evaluate the vascular anatomy. Multidetector CT imaging of the abdomen and pelvis was performed using the standard protocol during bolus administration of intravenous contrast. CONTRAST:  100 cc Isovue 370 COMPARISON:  Radiography same day FINDINGS: CTA CHEST FINDINGS Pulmonary arterial opacification is good. No pulmonary emboli. No aortic opacification is minimal. No sign of aneurysm or dissection as visualized. The heart is enlarged. No pericardial effusion. Coronary artery  calcification is noted. There are large bilateral pleural effusions layering dependently with dependent pulmonary atelectasis. The aerated lung shows interstitial prominence consistent with interstitial edema. No significant bone finding in the chest. CT ABDOMEN and PELVIS FINDINGS No hepatic abnormality is seen. There is hepatic venous reflux because of poor right heart function. The spleen is normal. No evidence of pancreatic mass or pancreatitis. No renal mass lesion is seen. There is hyperdense material in the renal collecting system bilaterally. On the right, this may be a combination of staghorn calculus and excreted contrast from a previous test injection. On the left, there almost certainly are numerous stones dependent in the lower pole and in the extrarenal pelvis and possibly a stone in the proximal ureter. Some of this density could relate to excreted contrast, but the densities do seem more discrete on this side. Unfortunately, delayed images were done within 1 minutes of the initial contrast administration. The aorta is unremarkable. IVC is normal. No retroperitoneal mass or lymphadenopathy. No free intraperitoneal  fluid or air. No acute bowel finding. Appendix is normal. Moderate amount of fecal matter in the colon. Uterus and adnexal regions are unremarkable. Foley catheter present within the bladder. Tiny amount of free fluid in the pelvis, not significant. IMPRESSION: CTA chest: No pulmonary emboli. Findings consistent with heart failure with large effusions and dependent pulmonary atelectasis. Cardiomegaly. Interstitial edema. CT abdomen pelvis: No evidence of acute pathology affecting the liver, spleen or pancreas. The patient has renal stone disease at least on the left, with small stones dependent in the collecting system and possibly in the proximal left ureter. Difficult to evaluate accurately due to the presence of some excreted contrast and the absence of significantly delayed imaging.  Electronically Signed   By: Paulina Fusi M.D.   On: 11/13/2015 20:27   Ct Cervical Spine Wo Contrast  11/02/2015  CLINICAL DATA:  55 year old female found down by family unresponsive. EXAM: CT HEAD WITHOUT CONTRAST CT CERVICAL SPINE WITHOUT CONTRAST TECHNIQUE: Multidetector CT imaging of the head and cervical spine was performed following the standard protocol without intravenous contrast. Multiplanar CT image reconstructions of the cervical spine were also generated. COMPARISON:  No priors. FINDINGS: CT HEAD FINDINGS Intubated patient. Orogastric tube in position. Low-attenuation in the left frontal region involving the gray matter, subcortical white matter and to underlying white matter, best appreciated on image 24 of series 2. No acute intra cerebral hemorrhage. No hydrocephalus. No shift of midline structures. Basal cisterns are patent. No acute displaced skull fractures. Visualized paranasal sinuses and mastoids are well pneumatized. CT CERVICAL SPINE FINDINGS No acute displaced fractures of the cervical spine. Alignment is anatomic. Prevertebral soft tissues cannot be evaluated secondary to the indwelling endotracheal and orogastric tubes. Visualized portions of the upper thorax demonstrate large bilateral pleural effusions lying dependently. IMPRESSION: 1. Large area of low attenuation in the left frontal lobe concerning for an area of subacute ischemia. Alternatively, a mass with surrounding edema is not excluded, and further evaluation with brain MRI with and without IV gadolinium is recommended in the near future. 2. No acute abnormality of the cervical spine. Critical Value/emergent results were called by telephone at the time of interpretation on 11/11/2015 at 7:41 pm to Dr. Eber Hong, who verbally acknowledged these results. Electronically Signed   By: Trudie Reed M.D.   On: 10/27/2015 19:44   Mr Laqueta Jean ZO Contrast  11/15/2015  CLINICAL DATA:  Severe shortness of breath, assess for  stroke. History of diabetes, pulmonary embolus, CHF. EXAM: MRI HEAD WITHOUT AND WITH CONTRAST TECHNIQUE: Multiplanar, multiecho pulse sequences of the brain and surrounding structures were obtained without and with intravenous contrast. CONTRAST:  15mL MULTIHANCE GADOBENATE DIMEGLUMINE 529 MG/ML IV SOLN COMPARISON:  CT head October 14, 2015 FINDINGS: INTRACRANIAL CONTENTS: No reduced diffusion to suggest acute ischemia. No susceptibility artifact to suggest hemorrhage. LEFT frontal lobe encephalomalacia and gliosis corresponding to CT abnormality. Smaller area of RIGHT posterior frontal/ insular encephalomalacia and LEFT mesial occipital lobe encephalomalacia. Ventricles and sulci are normal for patient's age. No midline shift, mass effect or mass lesions. Patchy pontine and scattered subcentimeter supratentorial white matter FLAIR T2 hyperintensities. Old RIGHT thalamus probable lacunar infarcts. No abnormal intraparenchymal enhancement. No abnormal extra-axial fluid collection, or masses. No abnormal extra-axial extra-axial enhancement. Major intracranial vascular flow voids present at skull base. ORBITS: The included ocular globes and orbital contents are non-suspicious. SINUSES: Trace paranasal sinus mucosal thickening. Mastoid air cells are well aerated. Layering secretions included pharynx. Life-support lines in place. SKULL/SOFT TISSUES: No abnormal  sellar expansion. No suspicious calvarial bone marrow signal. Craniocervical junction maintained. IMPRESSION: No acute intracranial process, specifically no acute ischemia or mass. 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 Old suspected RIGHT thalamus lacunar infarcts. Electronically Signed   By: Awilda Metro M.D.   On: 11/15/2015 06:19   Ct Abdomen Pelvis W  Contrast  November 21, 2015  CLINICAL DATA:  Found unresponsive by family. EXAM: CT ANGIOGRAPHY CHEST CT ABDOMEN AND PELVIS WITH CONTRAST TECHNIQUE: Multidetector CT imaging of the chest was performed using the standard protocol during bolus administration of intravenous contrast. Multiplanar CT image reconstructions and MIPs were obtained to evaluate the vascular anatomy. Multidetector CT imaging of the abdomen and pelvis was performed using the standard protocol during bolus administration of intravenous contrast. CONTRAST:  100 cc Isovue 370 COMPARISON:  Radiography same day FINDINGS: CTA CHEST FINDINGS Pulmonary arterial opacification is good. No pulmonary emboli. No aortic opacification is minimal. No sign of aneurysm or dissection as visualized. The heart is enlarged. No pericardial effusion. Coronary artery calcification is noted. There are large bilateral pleural effusions layering dependently with dependent pulmonary atelectasis. The aerated lung shows interstitial prominence consistent with interstitial edema. No significant bone finding in the chest. CT ABDOMEN and PELVIS FINDINGS No hepatic abnormality is seen. There is hepatic venous reflux because of poor right heart function. The spleen is normal. No evidence of pancreatic mass or pancreatitis. No renal mass lesion is seen. There is hyperdense material in the renal collecting system bilaterally. On the right, this may be a combination of staghorn calculus and excreted contrast from a previous test injection. On the left, there almost certainly are numerous stones dependent in the lower pole and in the extrarenal pelvis and possibly a stone in the proximal ureter. Some of this density could relate to excreted contrast, but the densities do seem more discrete on this side. Unfortunately, delayed images were done within 1 minutes of the initial contrast administration. The aorta is unremarkable. IVC is normal. No retroperitoneal mass or lymphadenopathy. No  free intraperitoneal fluid or air. No acute bowel finding. Appendix is normal. Moderate amount of fecal matter in the colon. Uterus and adnexal regions are unremarkable. Foley catheter present within the bladder. Tiny amount of free fluid in the pelvis, not significant. IMPRESSION: CTA chest: No pulmonary emboli. Findings consistent with heart failure with large effusions and dependent pulmonary atelectasis. Cardiomegaly. Interstitial edema. CT abdomen pelvis: No evidence of acute pathology affecting the liver, spleen or pancreas. The patient has renal stone disease at least on the left, with small stones dependent in the collecting system and possibly in the proximal left ureter. Difficult to evaluate accurately due to the presence of some excreted contrast and the absence of significantly delayed imaging. Electronically Signed   By: Paulina Fusi M.D.   On: 2015-11-21 20:27   US Renal  11/15/2015  CLINICAL DATA:  Elevated creatinine levels. EXAM: RENAL / URINARY TRACT ULTRASOUND COMPLETE COMPARISON:  None. FINDINGS: Right Kidney: Length: 12.5 cm. Echogenicity within normal limits. No mass or hydronephrosis visualized. Left Kidney: Length: 10.9 cm. Echogenicity within normal limits. No mass visualized. Moderate hydronephrosis is noted. Bladder: Appears normal for degree of bladder distention. Foley catheter is noted. IMPRESSION: Moderate left hydronephrosis is noted. Electronically Signed   By: Lupita Raider, M.D.   On: 11/15/2015 10:12   Dg Chest [facility] 1 [address]  11/15/2015  CLINICAL DATA:  Hypoxia EXAM: PORTABLE CHEST 1 VIEW COMPARISON:  Chest radiograph and chest CT November 14, 2015 FINDINGS: Endotracheal tube tip is 5.8 cm above the carina. Nasogastric tube tip and side port are below the diaphragm. No pneumothorax. There are layering effusions bilaterally, stable. There is airspace consolidation in the left base. There is mild edema, unchanged. No new opacity is evident. There is cardiomegaly with pulmonary  venous hypertension. No adenopathy evident. IMPRESSION: Tube positions as described without pneumothorax. Underlying congestive heart failure. Persistent consolidation left base. No new opacity. No change in cardiac silhouette. Electronically Signed   By: Bretta BangWilliam  Woodruff III M.D.   On: 11/15/2015 07:58   Dg Chest Portable 1 View  2015-09-05  CLINICAL DATA:  Endotracheal tube placement. Worsening shortness of breath today. EXAM: PORTABLE CHEST 1 VIEW COMPARISON:  None. FINDINGS: The endotracheal tube is 5 cm above the carina. The NG tube is coursing down the esophagus and into the stomach. The heart is borderline in size given the supine position the patient in the AP projection. Diffuse interstitial and airspace process is likely pulmonary edema. IMPRESSION: Endotracheal tube in good position, 5 cm above the carina. Pulmonary edema and possible small left pleural effusion. Electronically Signed   By: Rudie MeyerP.  Gallerani M.D.   On: 02017-02-08 17:15   Dg Knee Left Port  2015-09-05  CLINICAL DATA:  Fall on tile floor last night. Anterior knee swelling above the patella. EXAM: PORTABLE LEFT KNEE - 1-2 VIEW COMPARISON:  None. 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 Vascular calcifications also noted. Abnormal thickening of the prepatellar subcutaneous tissues suggesting prepatellar bursitis. No knee effusion. Probably degenerative osteochondral lesion along the posterior patellar ridge superiorly. Marginal articular spurring in the patella along with spurring along the patellar tendon attachment site. IMPRESSION: 1. Prominent prepatellar soft tissue swelling suggesting prepatellar bursitis. 2. Mild degenerative marginal articular spurring along the patella with a degenerative osteochondral lesion superiorly along the patellar articular surface. 3. 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 Electronically  Signed   By: Gaylyn RongWalter  Liebkemann M.D.   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 Refused CABG. Now presenting with resp failure due to pulmonary edema. On vent.   ASSESSMENT / PLAN:  PULMONARY A: Acute hypoxemic/hypercarbic respiratory failure in setting of pulmonary edema with known PE.   P:   PRVC 8cc/kg SBTs  CARDIOVASCULAR A:  Hypertensive emergency Known 3 vessel CAD Acute on chronic CHF Elevated lactic, likely due to increased WOB Recent MI  P:  Telemetry MAP goal ~ 90 first 24 hours, then SBP goal < 160 Resume home amlodipine for now Hold home metoprolol until UDS clear of cocaine Echo Diurese  RENAL A:   Kidney injury, unknown chronicity  Recent pyelo with lt hnosis  P:   KVO IVF Follow BMP May need urology input   GASTROINTESTINAL A:   No acute issues  P:   NPO Protonix Start TFs  HEMATOLOGIC A:   Anemia, unknown  basline Pulmonary embolism (on xarelto)  P: Heparin infusion per pharmacy Follow CBC, coags  INFECTIOUS A:   Recent pyelo and hydronephrosis completed ABX  P:   Monitor WBC and fever curve  ENDOCRINE A:   DM  P:   CBG monitoring and SSI  NEUROLOGIC A:   Acute metabolic encephalopathy ?Seizure history Subacute ischemia on MRI  P:   RASS goal: -1 Fentanyl infusion Low dose propofol ok Prn versed Continue home keppra Consulted neurology  FAMILY  - Updates: LCB, no CPR or defib per discussion with mother. Patient has been refusing  all therapies and has no desire to get better. Family knows that any resuscitation efforts even if successful would significantly worsen her level of health.   - Inter-disciplinary family meet or Palliative Care meeting due by:  4/26   The patient is critically ill with multiple organ systems failure and requires high complexity decision making for assessment and support, frequent evaluation and titration of therapies, application of advanced monitoring technologies and extensive interpretation of multiple databases. Critical Care Time devoted to patient care services described in this note independent of APP time is 35 minutes.   Cyril Mourning MD. Tonny Bollman. [facility] Pager [phone] If no response call [phone]   11/15/2015   11/15/2015 10:50 AM

## 2015-11-15 NOTE — Progress Notes (Signed)
Pt headed down to MRI

## 2015-11-15 NOTE — Consult Note (Signed)
Consultation Note Date: 11/15/2015   Patient Name: Laurie Hoover  DOB: 01-30-1961  MRN: [id number]  Age / Sex: 55 y.o., female  PCP: No primary care provider on file. Referring Physician: Rush Farmer, MD  Reason for Consultation: Establishing goals of care, Terminal Care and Withdrawal of life-sustaining treatment   55 yo with extensive substance abuse and advanced chronic medical problems due to self neglect and poor health related to her poor living conditions. Advanced CAD, an now with bilateral MCA strokes. Her prognosis is overwhelmingly poor.  I met with her mother who is her primary decision maker. Goals are for full comfort and we will do a terminal wean likely tomorrow AM - family requested that we try to get her brother who is incarcerated in to see her -I have called the prison facility and that is in process-he is at [facility].   Keep her comfortable on the vent until we can get family in and do a compassionate wean.   Compassionate Wean likely 4/21AM  No TF  D/C all diagnostics and labs, maintain essential meds only  ___________________________ Chief Complaint/ Primary Diagnoses: Present on Admission:  . Acute respiratory failure ([place]) . Pressure ulcer . Substance abuse, continuous . CAD (coronary artery disease) of artery bypass graft . Stroke [facility])  I have reviewed the medical record, interviewed the patient and family, and examined the patient. The following aspects are pertinent.  Past Medical History  Diagnosis Date  . Diabetes mellitus without complication ([place])   . CHF (congestive heart failure) ([place])   . PE (pulmonary embolism)   . MI (myocardial infarction) [facility])    Social History   Social History  . Marital Status: Unknown    Spouse Name: N/A  . Number of Children: N/A  . Years of Education: N/A   Social History Main Topics  . Smoking status: Not on file  . Smokeless  tobacco: Not on file  . Alcohol Use: Not on file  . Drug Use: Not on file  . Sexual Activity: Not on file   Other Topics Concern  . Not on file   Social History Narrative  . No narrative on file   No family history on file. Scheduled Meds: . amLODipine  10 mg Oral Daily  . antiseptic oral rinse  7 mL Mouth Rinse QID  . chlorhexidine gluconate (SAGE KIT)  15 mL Mouth Rinse BID  . feeding supplement (PRO-STAT SUGAR FREE 64)  30 mL Per Tube BID  . furosemide  40 mg Intravenous Once  . insulin aspart  2-6 Units Subcutaneous Q4H  . levETIRAcetam  500 mg Per Tube BID  . nitroGLYCERIN  5-200 mcg/min Intravenous Once  . pantoprazole (PROTONIX) IV  40 mg Intravenous QHS  . sodium chloride flush  3 mL Intravenous Q12H   Continuous Infusions: . dextrose 75 mL/hr at 11/15/15 1300  . feeding supplement (VITAL AF 1.2 CAL)    . fentaNYL infusion INTRAVENOUS 100 mcg/hr (11/15/15 1300)  . heparin 1,150 Units/hr (11/15/15 1300)  . norepinephrine (LEVOPHED) Adult infusion 1 mcg/min (11/15/15 1300)  . propofol (DIPRIVAN) infusion 20 mcg/kg/min (11/15/15 1300)   PRN Meds:.sodium chloride, sodium chloride, etomidate, fentaNYL, midazolam, nitroGLYCERIN, rocuronium, sodium chloride flush Medications Prior to Admission:  Prior to Admission medications   Medication Sig Start Date End Date Taking? Authorizing Provider  amLODipine (NORVASC) 10 MG tablet Take 10 mg by mouth daily.   Yes Historical Provider, MD  atorvastatin (LIPITOR) 80 MG tablet Take 80 mg by mouth  daily.   Yes Historical Provider, MD  busPIRone (BUSPAR) 10 MG tablet Take 10 mg by mouth 3 (three) times daily.   Yes Historical Provider, MD  insulin glargine (LANTUS) 100 UNIT/ML injection Inject 40 Units into the skin at bedtime.   Yes Historical Provider, MD  insulin lispro (HUMALOG) 100 UNIT/ML injection Inject 6 Units into the skin 3 (three) times daily before meals.   Yes Historical Provider, MD  levETIRAcetam (KEPPRA) 500 MG tablet  Take 500 mg by mouth 2 (two) times daily.   Yes Historical Provider, MD  metoprolol tartrate (LOPRESSOR) 25 MG tablet Take 25 mg by mouth 2 (two) times daily.   Yes Historical Provider, MD  traMADol (ULTRAM) 50 MG tablet Take 50 mg by mouth every 6 (six) hours as needed for moderate pain.   Yes Historical Provider, MD  XARELTO STARTER PACK 15 & 20 MG TBPK SEE INSTRUCTIONS ON PACKET-15MG TWICE DAILY FOR 21 DAYS, THEN SWITCHING TO 20MG ONCE DAILY (STARTED 11/13/15) 11/12/15  Yes Historical Provider, MD   No Known Allergies  Review of Systems  Physical Exam  Vital Signs: BP 118/60 mmHg  Pulse 72  Temp(Src) 99.5 F (37.5 C)  Resp 29  Ht 5' 5"  (1.651 m)  Wt 74.4 kg (164 lb 0.4 oz)  BMI 27.29 kg/m2  SpO2 100%  LMP   SpO2: SpO2: 100 % O2 Device:SpO2: 100 % O2 Flow Rate: .   IO: Intake/output summary:  Intake/Output Summary (Last 24 hours) at 11/15/15 1342 Last data filed at 11/15/15 1300  Gross per 24 hour  Intake 1533.66 ml  Output   1735 ml  Net -201.34 ml    LBM:   Baseline Weight: Weight: 81.647 kg (180 lb) Most recent weight: Weight: 74.4 kg (164 lb 0.4 oz)      Palliative Assessment/Data:    Additional Data Reviewed:  CBC:    Component Value Date/Time   WBC 9.5 11/15/2015 0720   HGB 9.6* 11/15/2015 0720   HCT 29.1* 11/15/2015 0720   PLT 247 11/15/2015 0720   MCV 87.7 11/15/2015 0720   Comprehensive Metabolic Panel:    Component Value Date/Time   NA 134* 11/15/2015 0730   K 4.2 11/15/2015 0730   CL 102 11/15/2015 0730   CO2 20* 11/15/2015 0730   BUN 31* 11/15/2015 0730   CREATININE 1.46* 11/15/2015 0730   GLUCOSE 242* 11/15/2015 0730   CALCIUM 8.1* 11/15/2015 0730   AST 89* 11/25/2015 1729   ALT 55* 11/04/2015 1729   ALKPHOS 143* 10/27/2015 1729   BILITOT 0.7 11/01/2015 1729   PROT 7.0 11/13/2015 1729   ALBUMIN 2.9* 11/11/2015 1729     Time In:1PM Time Out: 2pm  Time Total: 60 minutes Greater than 50%  of this time was spent counseling and  coordinating care related to the above assessment and plan.  Signed by: Roma Schanz, DO  11/15/2015, 1:42 PM  Please contact Palliative Medicine Team phone at [phone] for questions and concerns.

## 2015-11-15 NOTE — Progress Notes (Signed)
Hypoglycemic Event  CBG: 12  Treatment: D50 IV 50 mL  Symptoms: Sweaty  Follow-up CBG: Time:0415 CBG Result:112  Possible Reasons for Event: Inadequate meal intake  Comments/MD notified:MD Kasa notified    Shaquasha Gerstel L

## 2015-11-16 DIAGNOSIS — Z515 Encounter for palliative care: Secondary | ICD-10-CM | POA: Insufficient documentation

## 2015-11-16 DIAGNOSIS — J9601 Acute respiratory failure with hypoxia: Secondary | ICD-10-CM | POA: Insufficient documentation

## 2015-11-16 DIAGNOSIS — I5021 Acute systolic (congestive) heart failure: Secondary | ICD-10-CM | POA: Insufficient documentation

## 2015-11-16 LAB — GLUCOSE, CAPILLARY
GLUCOSE-CAPILLARY: 140 mg/dL — AB (ref 65–99)
Glucose-Capillary: 104 mg/dL — ABNORMAL HIGH (ref 65–99)
Glucose-Capillary: 189 mg/dL — ABNORMAL HIGH (ref 65–99)

## 2015-11-16 LAB — URINE CULTURE: CULTURE: NO GROWTH

## 2015-11-16 MED ORDER — HALOPERIDOL 0.5 MG PO TABS: 0.5000 mg | ORAL_TABLET | ORAL | Status: DC | PRN

## 2015-11-16 MED ORDER — FUROSEMIDE 10 MG/ML IJ SOLN
40.0000 mg | Freq: Once | INTRAMUSCULAR | Status: AC
  Administered 2015-11-16: 40 mg via INTRAVENOUS
  Filled 2015-11-16: qty 4

## 2015-11-16 MED ORDER — HALOPERIDOL LACTATE 2 MG/ML PO CONC
0.5000 mg | ORAL | Status: DC | PRN
  Filled 2015-11-16: qty 0.3

## 2015-11-16 MED ORDER — BIOTENE DRY MOUTH MT LIQD: 15.0000 mL | OROMUCOSAL | Status: DC | PRN

## 2015-11-16 MED ORDER — POLYVINYL ALCOHOL 1.4 % OP SOLN
1.0000 [drp] | Freq: Four times a day (QID) | OPHTHALMIC | Status: DC | PRN
  Filled 2015-11-16: qty 15

## 2015-11-16 MED ORDER — FENTANYL BOLUS VIA INFUSION
100.0000 ug | INTRAVENOUS | Status: DC | PRN
  Administered 2015-11-16: 100 ug via INTRAVENOUS
  Filled 2015-11-16: qty 100

## 2015-11-16 MED ORDER — HALOPERIDOL LACTATE 5 MG/ML IJ SOLN: 0.5000 mg | INTRAMUSCULAR | Status: DC | PRN

## 2015-11-16 MED ORDER — GLYCOPYRROLATE 0.2 MG/ML IJ SOLN
0.2000 mg | INTRAMUSCULAR | Status: DC | PRN
  Filled 2015-11-16: qty 1

## 2015-11-16 MED ORDER — ACETAMINOPHEN 650 MG RE SUPP: 650.0000 mg | Freq: Four times a day (QID) | RECTAL | Status: DC | PRN

## 2015-11-16 MED ORDER — ACETAMINOPHEN 325 MG PO TABS: 650.0000 mg | ORAL_TABLET | Freq: Four times a day (QID) | ORAL | Status: DC | PRN

## 2015-11-16 MED ORDER — SODIUM CHLORIDE 0.9 % IV SOLN: INTRAVENOUS | Status: DC

## 2015-11-16 MED ORDER — ONDANSETRON HCL 4 MG/2ML IJ SOLN: 4.0000 mg | Freq: Four times a day (QID) | INTRAMUSCULAR | Status: DC | PRN

## 2015-11-16 MED ORDER — FENTANYL BOLUS VIA INFUSION: 50.0000 ug | INTRAVENOUS | Status: DC | PRN

## 2015-11-16 MED ORDER — GLYCOPYRROLATE 1 MG PO TABS: 1.0000 mg | ORAL_TABLET | ORAL | Status: DC | PRN

## 2015-11-16 MED ORDER — ONDANSETRON 4 MG PO TBDP
4.0000 mg | ORAL_TABLET | Freq: Four times a day (QID) | ORAL | Status: DC | PRN
  Filled 2015-11-16: qty 1

## 2015-11-16 MED ORDER — MIDAZOLAM HCL 2 MG/2ML IJ SOLN: 2.0000 mg | INTRAMUSCULAR | Status: DC | PRN

## 2015-11-19 ENCOUNTER — Telehealth: Payer: Self-pay

## 2015-11-19 LAB — CULTURE, BLOOD (ROUTINE X 2)
CULTURE: NO GROWTH
CULTURE: NO GROWTH

## 2015-11-19 NOTE — Telephone Encounter (Signed)
On 11/19/2015 I received a death certificate from [employer] (original). The death certificate is for cremation. The patient is a patient of Doctor Vassie Lolllva. The death certificate will be taken to [employer] this pm for signature. On 11/20/2015 I received the death certificate back from Doctor Vassie LollAlva. I got the death certificate ready and called the funeral home to let them know the death certificate is ready for pickup. I also faxed a copy per their request.

## 2015-11-20 LAB — GLUCOSE, CAPILLARY: GLUCOSE-CAPILLARY: 12 mg/dL — AB (ref 65–99)

## 2015-11-26 NOTE — Progress Notes (Signed)
STROKE TEAM PROGRESS NOTE   HISTORY OF PRESENT ILLNESS Dr. Cena Hoover was unable to obtain hx from patient as she is intubated. There is no family at bedside. Per chart: "55 year old female with PMH as below, which includes DM, CHF, MI, and PE. She reportedly has a recent diagnosis of MI, but was not a candidate for PCI due to three vessel disease. When she was told she would require CABG she left hospital AMA. More recently (about one week ago) she was diagnosed with PE and pyelonephritis with obstructing stone and was started on xarelto, ABX. It is unclear whether or not she has been taking this. [facility]19 [facility] severely SOB. At that time she denied cough. She was intubated almost immediately on presentation. CXR for ETT placement was concerning for pulmonary edema and possible small left effusion. PCCM to admit." A head CT in the ED demonstrated a subacute embolic appearing stroke and neuro was consulted. Patient was not administered IV t-PA.    SUBJECTIVE (INTERVAL HISTORY) Her mother is at the bedside. Patient has been made comfort care as per her wishes and plans are to terminally wean today and withdraw support hence stroke related tests were cancelled OBJECTIVE Temp:  [98.4 F (36.9 C)-100.6 F (38.1 C)] 98.4 F (36.9 C) (04/21 1100) Pulse Rate:  [59-79] 62 (04/21 1100) Cardiac Rhythm:  [-] Normal sinus rhythm (04/21 0800) Resp:  [22-29] 22 (04/21 1100) BP: (89-132)/(52-69) 108/60 mmHg (04/21 1100) SpO2:  [100 %] 100 % (04/21 1131) FiO2 (%):  [40 %] 40 % (04/21 1200) Weight:  [166 lb 7.2 oz (75.5 kg)] 166 lb 7.2 oz (75.5 kg) (04/21 0600)  CBC:   Recent Labs Lab 11/08/2015 1729 11/05/2015 1811 11/15/15 0720  WBC 9.3  --  9.5  HGB 10.2* 13.3 9.6*  HCT 32.5* 39.0 29.1*  MCV 89.5  --  87.7  PLT 360  --  247    Basic Metabolic Panel:   Recent Labs Lab 11/23/2015 1729 11/22/2015 1811 10/27/2015 2317 11/15/15 0720 11/15/15 0730  NA 138 139  --   --   134*  K 4.8 4.8  --   --  4.2  CL 104 103  --   --  102  CO2 21*  --   --   --  20*  GLUCOSE 98 88  --  244* 242*  BUN 30* 34*  --   --  31*  CREATININE 1.33* 1.20*  --   --  1.46*  CALCIUM 8.4*  --   --   --  8.1*  MG  --   --  2.1  --  2.1  PHOS  --   --  5.1*  --  5.0*    Lipid Panel:     Component Value Date/Time   CHOL 117 11/03/2015 2317   TRIG 23 11/20/2015 2317   HDL 51 11/11/2015 2317   CHOLHDL 2.3 11/21/2015 2317   VLDL 5 11/02/2015 2317   LDLCALC 61 10/31/2015 2317   HgbA1c: No results found for: HGBA1C Urine Drug Screen:     Component Value Date/Time   LABOPIA NONE DETECTED 11/01/2015 0056   COCAINSCRNUR NONE DETECTED 11/17/2015 0056   LABBENZ POSITIVE* 10/27/2015 0056   AMPHETMU NONE DETECTED 10/29/2015 0056   THCU NONE DETECTED 11/07/2015 0056   LABBARB NONE DETECTED 11/04/2015 0056      IMAGING  Ct Head Wo Contrast 11/25/2015  1. Large area of low attenuation in the left frontal  lobe concerning for an area of subacute ischemia. Alternatively, a mass with surrounding edema is not excluded, and further evaluation with brain MRI with and without IV gadolinium is recommended in the near future. 2. No acute abnormality of the cervical spine.  Mr Laqueta Jean Wo Contrast 11/15/2015   No acute intracranial process, specifically no acute ischemia or mass. 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 Old suspected RIGHT thalamus lacunar infarcts.    PHYSICAL EXAM Middle-aged African-American lady who is intubated and sedated .She has a right below-knee amputation. . Afebrile. Head is nontraumatic. Neck is supple without bruit.    Cardiac exam no murmur or gallop. Lungs are clear to auscultation. Distal pulses are well felt except in right lower extremity. Neurological Exam ; Limited due  to sedation. Eyes are closed. Pupils 2 mm reactive. Fundi were not visualized. Face is symmetric. Tongue is midline. Motor system exam withdraws purposefully All 4 extremities. Right leg has below-knee amputation. ASSESSMENT/PLAN Ms. Laurie Hoover is a 55 y.o. female with history of DM, CHF, recent MI with CABG recommended and even more recent PE with pyelonephritis from obstructing stone placed on abx and xarelto presenting with severe SOB, intubated in ED. CT showed subacute embolic stroke. She did not receive IV t-PA.   Abnormal CT findings No acute infarct on MRI Encephalomalacia with gliosis bilateral frontal and left occipital from old infarcts  History of silent strokes  CT remote damage which could be from a stroke, no known distory  MRI  No acute stroke  Carotid Doppler  Canceled  CTA head and neck ordered  2D Echo bubble, changed to regular echo pending   LDL 61  HgbA1c pending  IV heparin for VTE prophylaxis Diet NPO time specified  Xarelto (rivaroxaban) daily ordered prior to admission but unclear if she has been taking , now on heparin IV  Therapy recommendations:  pending   Disposition:  pending   Dr. Pearlean Brownie spoke with DR. Vassie Loll - ? Goals of care. If palliative care is the goal, ok to cancel above tests  Respiratory failure  Intubated  Hypertensive Emergency  Unstable  Started on norepinephrine over night for hypotension  BP stable currently  Hyperlipidemia  Home meds:  lipitor 80  LDL 61, goal < 70  Resume lipitor when able    Continue statin at discharge  Diabetes  HgbA1c pending, goal < 7.0  Uncontrolled  Severe hypoglycemia during the night, started on D10   Other Stroke Risk Factors  Overweight, Body mass index is 27.7 kg/(m^2).   Coronary artery disease - recent MI with CABG recommended [facility])  Seizure hx  Most likely secondary to prior strokes  On keppra  Other Active Problems  Recent PE started on xarelto  [facility]ower)  Recent pyelo with obstructing stone  Acute on chronic CHF  R BKA  Patient left AMA after CABG recommended. She has been refusing all therapies.   Hospital day # 2   Agree with plans for comfort care. Stroke team will signoff. Call for questions. 11/02/2015 12:45 PM  I Delia Heady, MD Medical Director [facility] Pager: [phone] 11/25/2015 12:45 PM    To contact Stroke Continuity provider, please refer to [email or web address]. After hours, contact General Neurology

## 2015-11-26 NOTE — Discharge Summary (Signed)
PULMONARY / CRITICAL CARE MEDICINE   Name: Laurie Hoover MRN: [id number] DOB: [birth year]    ADMISSION DATE:  10/29/2015 CONSULTATION DATE:  4/19   HISTORY OF PRESENT ILLNESS:   55 year old female with PMH as below, which includes DM, CHF, MI, and PE. She reportedly has a recent diagnosis of MI, but was not a candidate for PCI due to three vessel disease. When she was told she would require CABG she left hospital AMA. More recently (about one week ago) she was diagnosed with PE and pyelonephritis with obstructing stone and was started on xarelto, ABX. It is unclear whether or not she has been taking this. 4/19 [facility] severely SOB. At that time she denied cough. She was intubated almost immediately on presentation. CXR for ETT placement was concerning for pulmonary edema and possible small left effusion. PCCM to admit.      STUDIES:  CTA chest 4/19 > no PE, int edema , BL effusions CT head 4/19 >left renal stone MRI 4/20 >>LEFT greater than RIGHT frontal encephalomalacia, LEFT occipital lobe encephalomalacia most compatible with bilateral MCA and LEFT posterior cerebral artery territory infarcts US renal - mod left hnosis   SIGNIFICANT EVENTS: 4/19 admit, intuabted  LINES/TUBES: ETT 4/19 >>>4/21  ASSESSMENT / PLAN:  PULMONARY A: Acute hypoxemic/hypercarbic respiratory failure in setting of pulmonary edema with known PE.    CARDIOVASCULAR A:  Hypertensive emergency Known 3 vessel CAD Acute on chronic CHF Elevated lactic, likely due to increased WOB Recent MI  diurese  RENAL A:   Kidney injury, unknown chronicity  Recent pyelo with lt hnosis   HEMATOLOGIC A:   Anemia, unknown basline Pulmonary embolism (on xarelto)  P: Heparin infusion per pharmacy Follow CBC, coags  INFECTIOUS A:   Recent pyelo and hydronephrosis completed ABX  P:   Monitor WBC and fever curve  ENDOCRINE A:   DM  P:   CBG monitoring and  SSI  NEUROLOGIC A:   Acute metabolic encephalopathy H/o Seizure  Subacute ischemia on MRI  P:   RASS goal: -1 Fentanyl infusion Low dose propofol ok Prn versed Continue home keppra Appreciate neurology input  FAMILY  - Updates: LCB, no CPR or defib per discussion with mother. 4/20  - Inter-disciplinary family meet or Palliative Care meeting :  4/20  Withdrawal of care after palliative discussion. She passed away soon after  Cause of death-acute respiratory failure due to pulmonary edema, CAD and hypertension  ALVA,RAKESH V. MD   11/22/2015 2:40 PM

## 2015-11-26 NOTE — Progress Notes (Signed)
Rt note-Patient was extubated per family and MD wishes. Family at bedside.

## 2015-11-26 NOTE — Progress Notes (Signed)
PULMONARY / CRITICAL CARE MEDICINE   Name: Laurie Hoover MRN: [id number] DOB: May 10, 1961    ADMISSION DATE:  2015/10/18 CONSULTATION DATE:  4/19  REFERRING MD:  EDP  CHIEF COMPLAINT:  SOB  HISTORY OF PRESENT ILLNESS:  Patient is encephalopathic and/or intubated. Therefore history has been obtained from chart review. 55 year old female with PMH as below, which includes DM, CHF, MI, and PE. She reportedly has a recent diagnosis of MI, but was not a candidate for PCI due to three vessel disease. When she was told she would require CABG she left hospital AMA. More recently (about one week ago) she was diagnosed with PE and pyelonephritis with obstructing stone and was started on xarelto, ABX. It is unclear whether or not she has been taking this. 4/19 [facility] severely SOB. At that time she denied cough. She was intubated almost immediately on presentation. CXR for ETT placement was concerning for pulmonary edema and possible small left effusion. PCCM to admit.    SUBJECTIVE:  Agitated on WUA No obvious CP, dyspnea Low gr fever Good UO with lasix  VITAL SIGNS: BP 127/78 mmHg  Pulse 72  Temp(Src) 98.4 F (36.9 C) (Oral)  Resp 20  Ht 5\' 5"  (1.651 m)  Wt 166 lb 7.2 oz (75.5 kg)  BMI 27.70 kg/m2  SpO2 100%  LMP   HEMODYNAMICS:    VENTILATOR SETTINGS: Vent Mode:  [-] PRVC FiO2 (%):  [40 %] 40 % Set Rate:  [26 bmp] 26 bmp Vt Set:  [440 mL] 440 mL PEEP:  [5 cmH20] 5 cmH20 Plateau Pressure:  [20 cmH20-22 cmH20] 20 cmH20  INTAKE / OUTPUT: I/O last 3 completed shifts: In: 4724.2 [I.V.:4484.2; NG/GT:240] Out: 3790 [Urine:3790]  PHYSICAL EXAMINATION: General:  Obese female in vent Neuro:  Sedated on vent HEENT:  [place]/AT, PERRL, No JVD noted Cardiovascular:  RRR, no MRG Lungs:  Coarse Abdomen:  Soft, non-distended Musculoskeletal:  R BKA Skin:  Grossly intact  LABS:  BMET  Recent Labs Lab 01/01/2016 1729 01/01/2016 1811 11/15/15 0720  11/15/15 0730  NA 138 139  --  134*  K 4.8 4.8  --  4.2  CL 104 103  --  102  CO2 21*  --   --  20*  BUN 30* 34*  --  31*  CREATININE 1.33* 1.20*  --  1.46*  GLUCOSE 98 88 244* 242*    Electrolytes  Recent Labs Lab 01/01/2016 1729 01/01/2016 2317 11/15/15 0730  CALCIUM 8.4*  --  8.1*  MG  --  2.1 2.1  PHOS  --  5.1* 5.0*    CBC  Recent Labs Lab 01/01/2016 1729 01/01/2016 1811 11/15/15 0720  WBC 9.3  --  9.5  HGB 10.2* 13.3 9.6*  HCT 32.5* 39.0 29.1*  PLT 360  --  247    Coag's  Recent Labs Lab 01/01/2016 1729 01/01/2016 2317 11/15/15 0720  APTT 35  --  153*  INR 1.34 1.38  --     Sepsis Markers  Recent Labs Lab 01/01/2016 1812 01/01/2016 2317 11/15/15 0730  LATICACIDVEN 2.44* 1.1 1.2    ABG  Recent Labs Lab 01/01/2016 1718 01/01/2016 1831 01/01/2016 2108  PHART 7.262* 7.293* 7.408  PCO2ART 56.3* 53.4* 37.9  PO2ART 64.0* 51.0* 55.0*    Liver Enzymes  Recent Labs Lab 01/01/2016 1729  AST 89*  ALT 55*  ALKPHOS 143*  BILITOT 0.7  ALBUMIN 2.9*    Cardiac Enzymes  Recent Labs Lab 01/01/2016 2317  11/15/15 0730  TROPONINI 0.05* 0.04*    Glucose  Recent Labs Lab 11/15/15 1608 11/15/15 1640 11/15/15 1934 11/15/15 2344 11/22/2015 0403 11/06/2015 0814  GLUCAP 57* 74 74 104* 140* 189*    Imaging No results found.   STUDIES:  CTA chest 4/19 > no PE, int edema , BL effusions CT head 4/19 >left renal stone MRI 4/20 >>LEFT greater than RIGHT frontal encephalomalacia, LEFT occipital lobe encephalomalacia most compatible with bilateral MCA and LEFT posterior cerebral artery territory infarcts US renal - mod left hnosis  CULTURES: n/a  ANTIBIOTICS: none  SIGNIFICANT EVENTS: 4/19 admit, intuabted  LINES/TUBES: ETT 4/19 >>>  DISCUSSION: 55 year old female with recent multivessel CAD and PE diagnosis. Refused CABG. Now presenting with resp failure due to pulmonary edema. On vent.   ASSESSMENT / PLAN:  PULMONARY A: Acute  hypoxemic/hypercarbic respiratory failure in setting of pulmonary edema with known PE.   P:   PRVC 8cc/kg Hold SBTs  CARDIOVASCULAR A:  Hypertensive emergency Known 3 vessel CAD Acute on chronic CHF Elevated lactic, likely due to increased WOB Recent MI  P:  Telemetry MAP goal ~ 90 first 24 hours, then SBP goal < 160 Resume home amlodipine for now Hold home metoprolol until UDS clear of cocaine Diurese  RENAL A:   Kidney injury, unknown chronicity  Recent pyelo with lt hnosis  P:   KVO IVF Follow BMP Defer urology input   GASTROINTESTINAL A:   No acute issues  P:   NPO Protonix ct TFs  HEMATOLOGIC A:   Anemia, unknown basline Pulmonary embolism (on xarelto)  P: Heparin infusion per pharmacy Follow CBC, coags  INFECTIOUS A:   Recent pyelo and hydronephrosis completed ABX  P:   Monitor WBC and fever curve  ENDOCRINE A:   DM  P:   CBG monitoring and SSI  NEUROLOGIC A:   Acute metabolic encephalopathy H/o Seizure  Subacute ischemia on MRI  P:   RASS goal: -1 Fentanyl infusion Low dose propofol ok Prn versed Continue home keppra Appreciate neurology input  FAMILY  - Updates: LCB, no CPR or defib per discussion with mother. 4/20  - Inter-disciplinary family meet or Palliative Care meeting due by:  4/20  Withdrawal of care planned today after brother is able to visit  The patient is critically ill with multiple organ systems failure and requires high complexity decision making for assessment and support, frequent evaluation and titration of therapies, application of advanced monitoring technologies and extensive interpretation of multiple databases. Critical Care Time devoted to patient care services described in this note independent of APP time is 31 minutes.   Cyril Mourning MD. Tonny Bollman. [place] [facility] Pager [phone] If no response call [phone]   11/01/2015   11/06/2015 2:53 PM

## 2015-11-26 NOTE — Progress Notes (Signed)
Family gathered at bedside. Proceed with compassionate extubation. Comfort care and EOL orders are in place. I provided support to patient and her mother during my visit and answered all of their questions. I anticipate a rapid decline after extubation.   - Uses bolus dosing to control dyspnea - OK to leave current rate of propofol and titrate fentanyl infusion for increased WOB -high risk for hypoxic seizure-maintain propofol at current rate  Full comfort care is the goals.  Order placed for chaplain.  Time: 12:30-105PM Total: 35 min Greater than 50%  of this time was spent counseling and coordinating care related to the above assessment and plan.   Laurie MaltaElizabeth Golding, DO Palliative Medicine [phone]

## 2015-11-26 NOTE — Accreditation Note (Signed)
o Restraints not reported to CMS Pursuant to regulation 482.13 (G) (3) use of soft wrist restraints was logged on 04.24.2017 at 0723 by Rosine DoorAngus Frank Novelo, RN, Accreditation and Patient Safety.

## 2015-11-26 NOTE — Progress Notes (Signed)
   11/10/15 1500  Clinical Encounter Type  Visited With Family  Visit Type Patient actively dying  Referral From Palliative care team  Spiritual Encounters  Spiritual Needs Prayer;Emotional  Stress Factors  Family Stress Factors Loss;Family relationships  Chaplain responded to call for end of life. Attended fairly large family at bedside, offering prayer before and at time of death. Suggested that her favorite music be played in lieu of the variety show on television, which worked out well. Waited to answer questions of family and discussed case with [facility]. Hiba Garry, Chaplain

## 2015-11-26 DEATH — deceased

## 2016-04-23 ENCOUNTER — Telehealth: Payer: Self-pay

## 2016-04-23 NOTE — Telephone Encounter (Signed)
On 04/23/2016 I received a email from Dennis BastJoyce Johnson [facility] and she needs something added to the death certificate.  I will be taking the death certificate and the email to Doctor Vassie LollAlva at the Pulmonary Unit @ [facility] this pm so he can correct this mistake. On 04/28/2016 The supplemental [facility].

## 2016-12-23 IMAGING — MR MR HEAD WO/W CM
9 of 12 series · 34 of 48 positions shown · IV contrast (Yes   MULTIHANCE)
Comparison: CT head October 14, 2015

CLINICAL DATA: Severe shortness of breath, assess for stroke.
History of diabetes, pulmonary embolus, CHF.

EXAM:
MRI HEAD WITHOUT AND WITH CONTRAST
TECHNIQUE: Multiplanar, multiecho pulse sequences of the brain and surrounding
structures were obtained without and with intravenous contrast.
CONTRAST:  15mL MULTIHANCE GADOBENATE DIMEGLUMINE 529 MG/ML IV SOLN

[Series 4: T1 · sagittal · 5.0mm · 0.47mm/px · 3 of 23 slices shown]
[im 1/23]
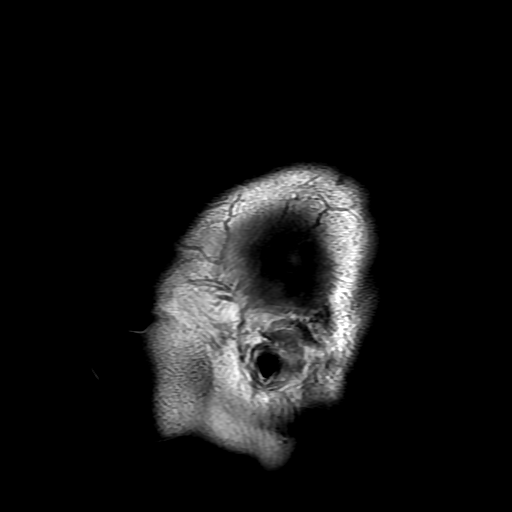
[im 12/23]
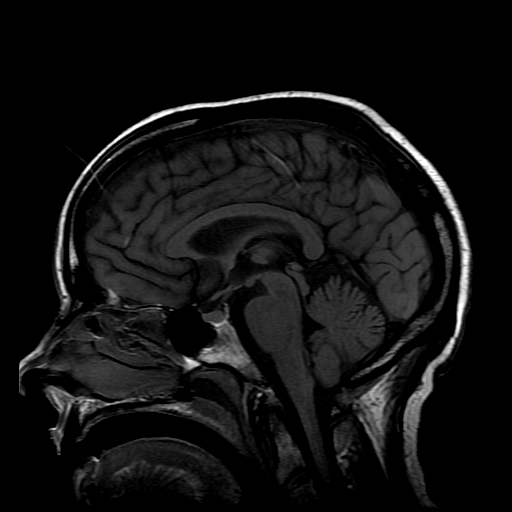
[im 23/23]
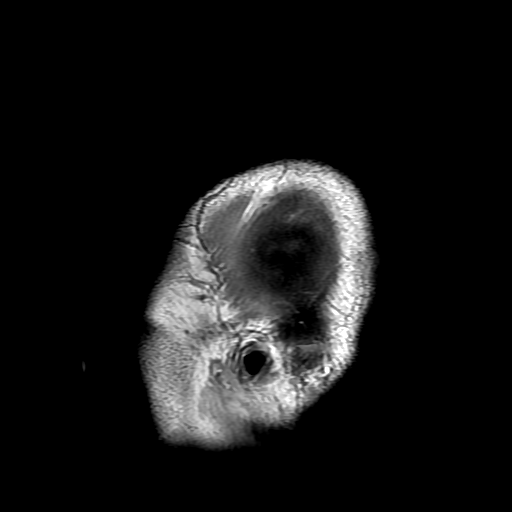

[Series 5: DWI · axial · 3.0mm · 1.09mm/px · z∈[-72,+63]mm · 9 of 92 slices shown (1 of 4)]
[im 1/92]
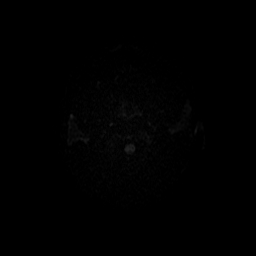
[im 12/92]
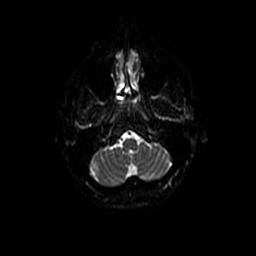
[im 23/92]
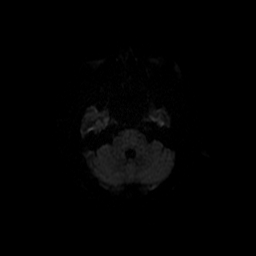
[im 35/92]
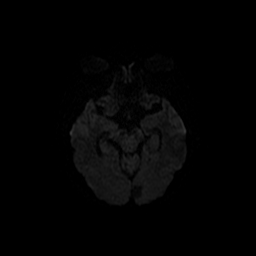
[im 46/92]
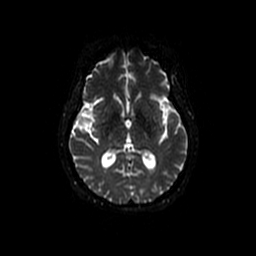
[im 57/92]
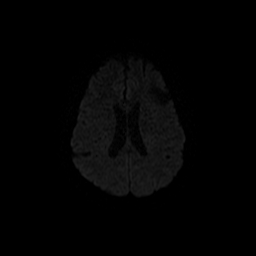
[im 69/92]
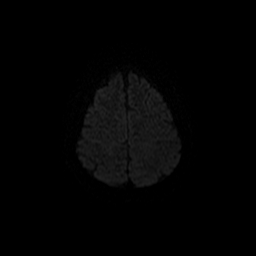
[im 80/92]
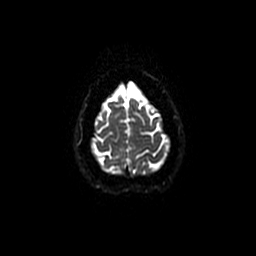
[im 92/92]
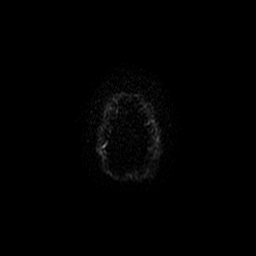

[Series 6: T2 · axial · 5.0mm · 0.43mm/px · z∈[-70,+62]mm · 3 of 23 slices shown]
[im 1/23]
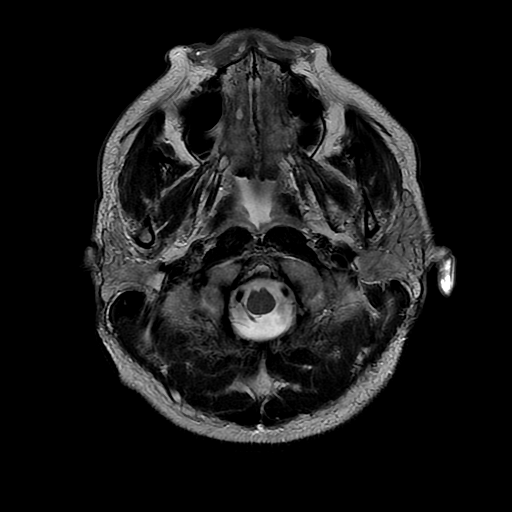
[im 12/23]
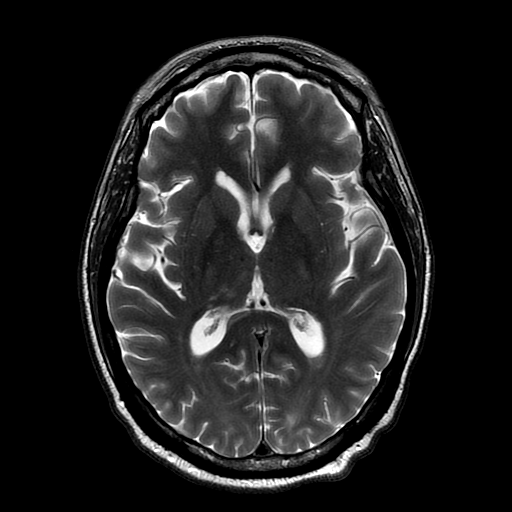
[im 23/23]
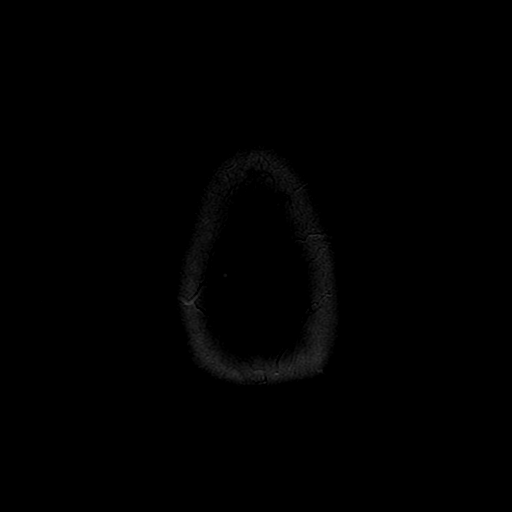

[Series 7: DWI · coronal · 5.0mm · 1.09mm/px · 6 of 70 slices shown (2 of 4)]
[im 1/70]
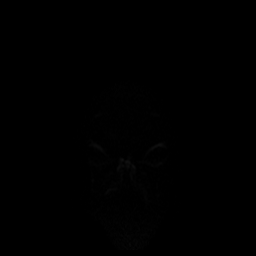
[im 14/70]
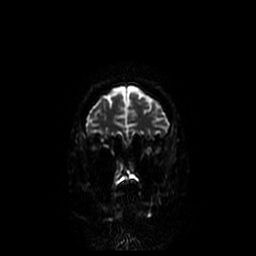
[im 28/70]
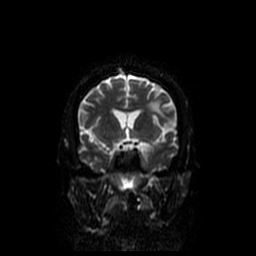
[im 42/70]
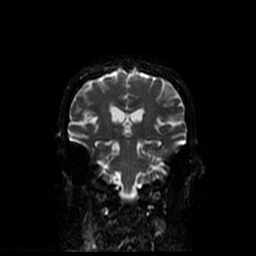
[im 56/70]
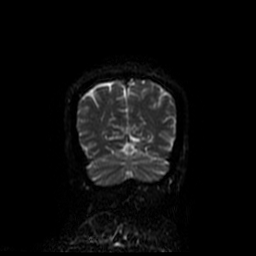
[im 70/70]
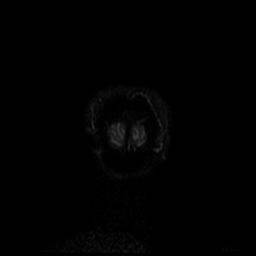

[Series 8: FLAIR · axial · 5.0mm · 0.43mm/px · z∈[-70,+62]mm · 2 of 23 slices shown]
[im 1/23]
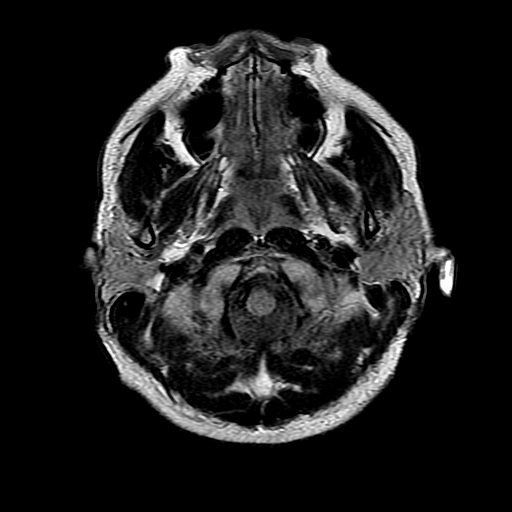
[im 23/23]
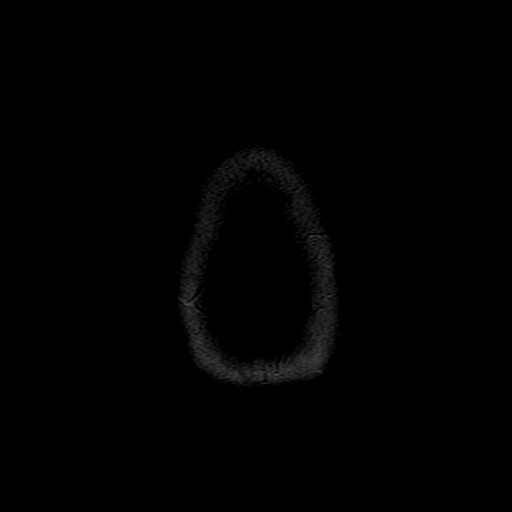

[Series 11: T2 post-contrast · coronal · 5.0mm · 0.39mm/px · 2 of 27 slices shown]
[im 1/27]
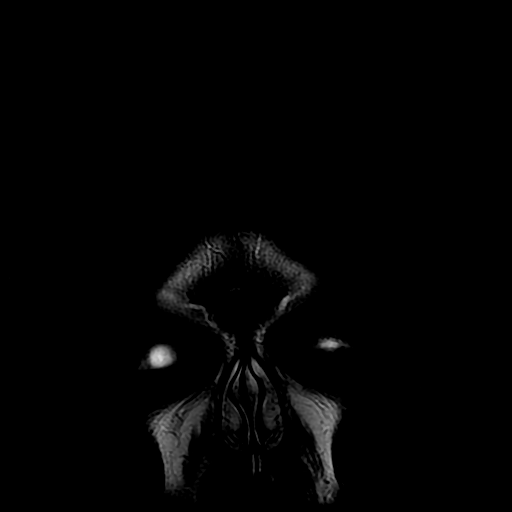
[im 27/27]
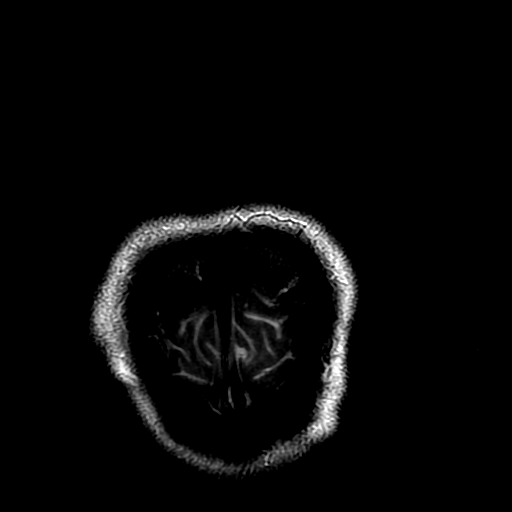

[Series 13: T1 post-contrast · coronal · 5.0mm · 0.39mm/px · 2 of 27 slices shown]
[im 1/27]
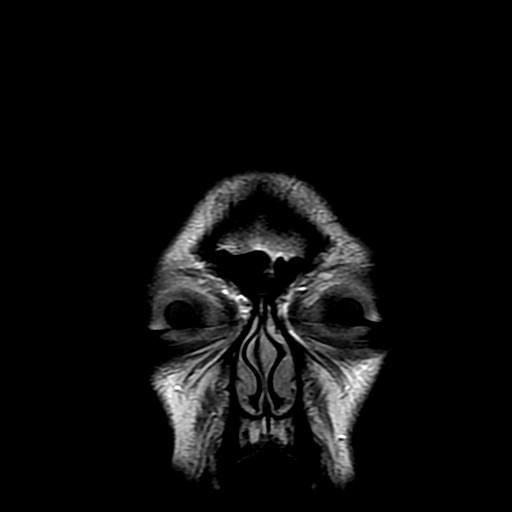
[im 27/27]
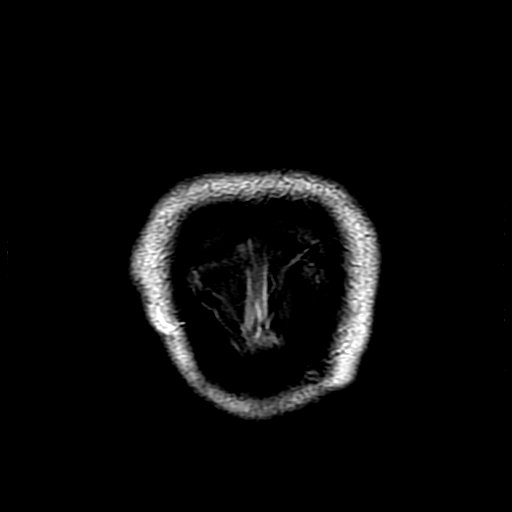

[Series 500: DWI · axial · 3.0mm · 1.09mm/px · z∈[-72,+63]mm · 4 of 46 slices shown (3 of 4)]
[im 1/46]
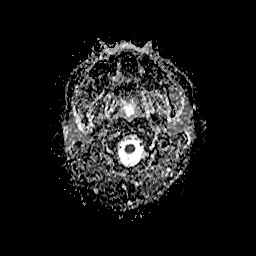
[im 16/46]
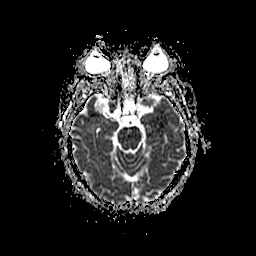
[im 31/46]
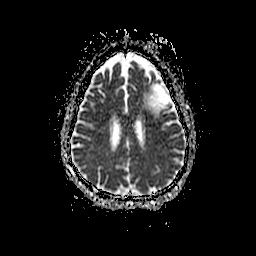
[im 46/46]
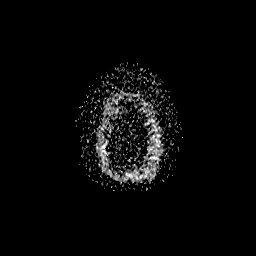

[Series 700: DWI · coronal · 5.0mm · 1.09mm/px · 3 of 35 slices shown (4 of 4)]
[im 1/35]
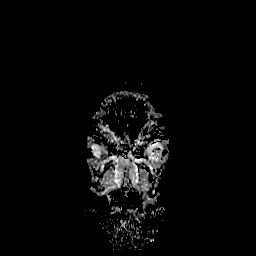
[im 18/35]
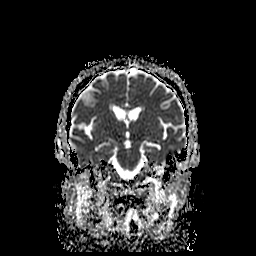
[im 35/35]
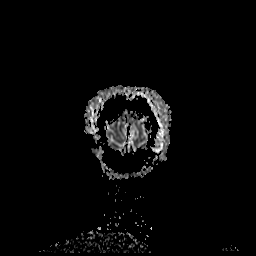

[34 of 48 positions shown; findings below may reference images not displayed]

FINDINGS: INTRACRANIAL CONTENTS: No reduced diffusion to suggest acute
ischemia. No susceptibility artifact to suggest hemorrhage. LEFT
frontal lobe encephalomalacia and gliosis corresponding to CT
abnormality. Smaller area of RIGHT posterior frontal/ insular
encephalomalacia and LEFT mesial occipital lobe encephalomalacia.
Ventricles and sulci are normal for patient's age. No midline shift,
mass effect or mass lesions. Patchy pontine and scattered
subcentimeter supratentorial white matter FLAIR T2 hyperintensities.
Old RIGHT thalamus probable lacunar infarcts. No abnormal
intraparenchymal enhancement. No abnormal extra-axial fluid
collection, or masses. No abnormal extra-axial extra-axial
enhancement. Major intracranial vascular flow voids present at skull
base.

ORBITS: The included ocular globes and orbital contents are
non-suspicious.

SINUSES: Trace paranasal sinus mucosal thickening. Mastoid air cells
are well aerated. Layering secretions included pharynx. Life-support
lines in place.

SKULL/SOFT TISSUES: No abnormal sellar expansion. No suspicious
calvarial bone marrow signal. Craniocervical junction maintained.
IMPRESSION: No acute intracranial process, specifically no acute ischemia or
mass.

LEFT greater than RIGHT frontal encephalomalacia, LEFT occipital
lobe encephalomalacia most compatible with bilateral MCA and LEFT
posterior cerebral artery territory infarcts.

Mild to moderate chronic small vessel ischemic disease. Old
suspected RIGHT thalamus lacunar infarcts.
